# Patient Record
Sex: Female | Born: 1995 | Race: Black or African American | Hispanic: No | Marital: Single | Smoking: Never smoker
Health system: Southern US, Community
[De-identification: ages and names within clinical notes are randomized; demographics above are authoritative.]

## PROBLEM LIST (undated history)

## (undated) DIAGNOSIS — Z789 Other specified health status: Secondary | ICD-10-CM

## (undated) HISTORY — PX: NO PAST SURGERIES: SHX2092

---

## 2016-02-20 ENCOUNTER — Emergency Department (HOSPITAL_COMMUNITY)
Admission: EM | Admit: 2016-02-20 | Discharge: 2016-02-20 | Disposition: A | Discharge status: Home / Self Care | Payer: Medicaid Other | Attending: Emergency Medicine | Admitting: Emergency Medicine

## 2016-02-20 ENCOUNTER — Encounter (HOSPITAL_COMMUNITY): Payer: Self-pay | Admitting: Emergency Medicine

## 2016-02-20 DIAGNOSIS — J029 Acute pharyngitis, unspecified: Secondary | ICD-10-CM | POA: Diagnosis not present

## 2016-02-20 LAB — RAPID STREP SCREEN (MED CTR MEBANE ONLY): Streptococcus, Group A Screen (Direct): NEGATIVE

## 2016-02-20 NOTE — ED Notes (Signed)
Declined W/C at D/C and was escorted to lobby by RN. 

## 2016-02-20 NOTE — ED Provider Notes (Signed)
[facility] Provider Note   CSN: [id number] Arrival date & time: 02/20/16  1305     History   Chief Complaint Chief Complaint  Patient presents with  . Sore Throat    HPI Morgan Carlson is a 20 y.o. female.  HPI   20 year old female presents today with complaints of sore throat. Patient reports 2 days ago she started having upper respiratory congestion and sore throat. She reports it is painful to swallow, but has no difficulty swallowing eating or drinking. She denies any fever, neck stiffness, drooling, muffled speech, chest pain or shortness of breath. She denies any other complaints. She has not tried any medications prior to arrival.  History reviewed. No pertinent past medical history.  There are no active problems to display for this patient.   History reviewed. No pertinent surgical history.  OB History    No data available       Home Medications    Prior to Admission medications   Not on File    Family History No family history on file.  Social History Social History  Substance Use Topics  . Smoking status: Never Smoker  . Smokeless tobacco: Never Used  . Alcohol use No     Allergies   Review of patient's allergies indicates not on file.   Review of Systems Review of Systems  All other systems reviewed and are negative.    Physical Exam Updated Vital Signs BP 115/69 (BP Location: Left Arm)   Pulse 81   Temp 98.3 F (36.8 C) (Oral)   Resp 16   Ht 5\' 6"  (1.676 m)   Wt 113.9 kg   LMP 01/26/2016   SpO2 100%   BMI 40.53 kg/m   Physical Exam  Constitutional: She is oriented to person, place, and time. She appears well-developed and well-nourished.  HENT:  Head: Normocephalic and atraumatic.  Uvula is midline and rises with phonation, minor amount of tonsillar erythema, and swelling. Symmetrical, no signs of retropharyngeal or peritonsillar abscess. Neck is supple with full active range of motion, no exudate noted  Eyes:  Conjunctivae are normal. Pupils are equal, round, and reactive to light. Right eye exhibits no discharge. Left eye exhibits no discharge. No scleral icterus.  Neck: Normal range of motion. No JVD present. No tracheal deviation present.  Pulmonary/Chest: Effort normal. No stridor.  Neurological: She is alert and oriented to person, place, and time. Coordination normal.  Psychiatric: She has a normal mood and affect. Her behavior is normal. Judgment and thought content normal.  Nursing note and vitals reviewed.   ED Treatments / Results  Labs (all labs ordered are listed, but only abnormal results are displayed) Labs Reviewed  RAPID STREP SCREEN (NOT AT [facility])  CULTURE, GROUP A STREP [facility])    EKG  EKG Interpretation None       Radiology No results found.  Procedures Procedures (including critical care time)  Medications Ordered in ED Medications - No data to display   Initial Impression / Assessment and Plan / ED Course  I have reviewed the triage vital signs and the nursing notes.  Pertinent labs & imaging results that were available during my care of the patient were reviewed by me and considered in my medical decision making (see chart for details).  Clinical Course      Final Clinical Impressions(s) / ED Diagnoses   Final diagnoses:  Pharyngitis    Labs:Rapid strep negative  Imaging:  Consults:  Therapeutics:  Discharge Meds:   Assessment/Plan:  Pt presents with likely viral pharyngitis. No difficulty swallowing, drooling, dysphonia,muffled voice, stridor, swelling of the neck, trismus, mouth pain, swelling/ pain in submandibular area or floor of mouth ,assymetry of tonsils, or ulcerations; unlikely epiglottitis, PTA, submandibular space infection, retropharyngeal space infection, or HIV. Pt treated here in the ED with therapeutics listed above, given strict return precautions, PCP follow-up for re-evaluation if symptoms persist beyond 5-7 days in  duration, return to the ED if they worsen. Pt verbalized understanding and agreement to today's plan and had no further questions or concerns at the time of discharge.       New Prescriptions New Prescriptions   No medications on file     Eyvonne Mechanic, PA-C 02/20/16 1452    Doug Sou, MD 02/20/16 1651

## 2016-02-20 NOTE — Discharge Instructions (Signed)
Please follow-up with primary care for reevaluation of symptoms persist, return to the emergency room immediately if they worsen.

## 2016-02-20 NOTE — ED Triage Notes (Signed)
Pt. Stated, I've had a sore throat for 2 days.

## 2016-02-22 LAB — CULTURE, GROUP A STREP (THRC)

## 2016-05-01 ENCOUNTER — Emergency Department (HOSPITAL_BASED_OUTPATIENT_CLINIC_OR_DEPARTMENT_OTHER)
Admission: EM | Admit: 2016-05-01 | Discharge: 2016-05-01 | Disposition: A | Discharge status: Home / Self Care | Payer: Medicaid Other | Attending: Emergency Medicine | Admitting: Emergency Medicine

## 2016-05-01 ENCOUNTER — Encounter (HOSPITAL_BASED_OUTPATIENT_CLINIC_OR_DEPARTMENT_OTHER): Payer: Self-pay | Admitting: Emergency Medicine

## 2016-05-01 DIAGNOSIS — J029 Acute pharyngitis, unspecified: Secondary | ICD-10-CM | POA: Diagnosis present

## 2016-05-01 LAB — RAPID STREP SCREEN (MED CTR MEBANE ONLY): Streptococcus, Group A Screen (Direct): NEGATIVE

## 2016-05-01 MED ORDER — DEXAMETHASONE SODIUM PHOSPHATE 10 MG/ML IJ SOLN
10.0000 mg | Freq: Once | INTRAMUSCULAR | Status: AC
  Administered 2016-05-01: 10 mg via INTRAMUSCULAR
  Filled 2016-05-01: qty 1

## 2016-05-01 NOTE — ED Notes (Signed)
Pt and mother given d/c instructions as per chart. Verbalizes understanding. No questions. 

## 2016-05-01 NOTE — ED Provider Notes (Signed)
[facility]   CSN: [id number] Arrival date & time: 05/01/16  1925  By signing my name below, I, Octavia Heirrianna Nassar, attest that this documentation has been prepared under the direction and in the presence of Nira ConnPedro Eduardo Tawnia Schirm, MD.  Electronically Signed: Octavia HeirArianna Nassar, ED Scribe. 05/01/16. 10:00 PM.    History   Chief Complaint Chief Complaint  Patient presents with  . Sore Throat    The history is provided by the patient. No language interpreter was used.   HPI Comments: Morgan Carlson is a 20 y.o. female who presents to the Emergency Department complaining of intermittent, gradual worsening sore throat x 5 days. Associated nasal congestion, post-nasal drip, and fever that has alleviated. She has pain with eating and drinking secondary to pain. Pt states she has been having issues with her throat for the past 2 months. She was seen multiple times for her sore throat and notes being diagnosed with strep throat on 11/02. She was prescribed Zithromax to alleviate her sore throat without any relief. Denies rhinorrhea, abdominal pain, or cough.  History reviewed. No pertinent past medical history.  There are no active problems to display for this patient.   History reviewed. No pertinent surgical history.  OB History    No data available       Home Medications    Prior to Admission medications   Not on File    Family History History reviewed. No pertinent family history.  Social History Social History  Substance Use Topics  . Smoking status: Never Smoker  . Smokeless tobacco: Never Used  . Alcohol use No     Allergies   Patient has no known allergies.   Review of Systems Review of Systems  All other systems reviewed and are negative.   A complete 10 system review of systems was obtained and all systems are negative except as noted in the HPI and PMH.   Physical Exam Updated Vital Signs BP 128/83   Pulse 85   Temp 98.6 F (37  C)   Resp 18   Wt 235 lb (106.6 kg)   SpO2 99%   BMI 37.93 kg/m   Physical Exam  Constitutional: She is oriented to person, place, and time. She appears well-developed and well-nourished. No distress.  HENT:  Head: Normocephalic and atraumatic.  Nose: Mucosal edema present.  Mouth/Throat: Posterior oropharyngeal edema and posterior oropharyngeal erythema present. No oropharyngeal exudate or tonsillar abscesses. Tonsils are 3+ on the right. Tonsils are 3+ on the left. Tonsillar exudate.  Swollen nasal mucosa, tonsillar exudates bilaterally  Eyes: Conjunctivae and EOM are normal. Pupils are equal, round, and reactive to light. Right eye exhibits no discharge. Left eye exhibits no discharge. No scleral icterus.  Neck: Normal range of motion. Neck supple.  No lymphadenopathy  Cardiovascular: Normal rate and regular rhythm.  Exam reveals no gallop and no friction rub.   No murmur heard. Pulmonary/Chest: Effort normal and breath sounds normal. No stridor. No respiratory distress. She has no rales.  Abdominal: Soft. She exhibits no distension. There is no tenderness.  No splenomegaly   Musculoskeletal: She exhibits no edema or tenderness.  Neurological: She is alert and oriented to person, place, and time.  Skin: Skin is warm and dry. No rash noted. She is not diaphoretic. No erythema.  Psychiatric: She has a normal mood and affect.  Nursing note and vitals reviewed.    ED Treatments / Results  DIAGNOSTIC STUDIES: Oxygen Saturation is 99% on RA, normal  by my interpretation.  COORDINATION OF CARE:  9:54 PM Discussed treatment plan with pt at bedside and pt agreed to plan.  Labs (all labs ordered are listed, but only abnormal results are displayed) Labs Reviewed  RAPID STREP SCREEN (NOT AT [facility])  CULTURE, GROUP A STREP [facility])    EKG  EKG Interpretation None       Radiology No results found.  Procedures Procedures (including critical care time)  Medications Ordered in  ED Medications - No data to display   Initial Impression / Assessment and Plan / ED Course  I have reviewed the triage vital signs and the nursing notes.  Pertinent labs & imaging results that were available during my care of the patient were reviewed by me and considered in my medical decision making (see chart for details).  Clinical Course     Pharyngitis on exam. Rapid strep negative. Patient already on azithromycin. Cultures sent will await the results. Instructed to complete her course of azithromycin. If cultures show that patient requires other antibiotics due to resistance we will contact the patient.  Safe for discharge with strict return precautions.  Final Clinical Impressions(s) / ED Diagnoses   Final diagnoses:  None   I personally performed the services described in this documentation, which was scribed in my presence. The recorded information has been reviewed and is accurate.   Disposition: Discharge  Condition: Good  I have discussed the results, Dx and Tx plan with the patient who expressed understanding and agree(s) with the plan. Discharge instructions discussed at great length. The patient was given strict return precautions who verbalized understanding of the instructions. No further questions at time of discharge.    New Prescriptions   No medications on file    Follow Up: Primary Care Provider  Schedule an appointment as soon as possible for a visit        Nira ConnPedro Eduardo Kyran Connaughton, MD 05/01/16 2213

## 2016-05-01 NOTE — ED Triage Notes (Signed)
Pt in c/o sore throat x 2 months. Seen multiple times, treated for strep. Pt alert, interactive, ambulatory in NAD.

## 2016-05-04 LAB — CULTURE, GROUP A STREP (THRC)

## 2018-01-24 ENCOUNTER — Encounter (HOSPITAL_COMMUNITY): Payer: Self-pay

## 2018-01-24 ENCOUNTER — Ambulatory Visit (HOSPITAL_COMMUNITY)
Admission: EM | Admit: 2018-01-24 | Discharge: 2018-01-24 | Disposition: A | Discharge status: Home / Self Care | Payer: Self-pay | Attending: Internal Medicine | Admitting: Internal Medicine

## 2018-01-24 DIAGNOSIS — H9201 Otalgia, right ear: Secondary | ICD-10-CM

## 2018-01-24 DIAGNOSIS — J351 Hypertrophy of tonsils: Secondary | ICD-10-CM

## 2018-01-24 DIAGNOSIS — Z9109 Other allergy status, other than to drugs and biological substances: Secondary | ICD-10-CM

## 2018-01-24 DIAGNOSIS — R070 Pain in throat: Secondary | ICD-10-CM

## 2018-01-24 DIAGNOSIS — J029 Acute pharyngitis, unspecified: Secondary | ICD-10-CM

## 2018-01-24 LAB — POCT RAPID STREP A: STREPTOCOCCUS, GROUP A SCREEN (DIRECT): NEGATIVE

## 2018-01-24 MED ORDER — PSEUDOEPHEDRINE HCL ER 120 MG PO TB12: 120.0000 mg | ORAL_TABLET | Freq: Two times a day (BID) | ORAL | 3 refills | Status: DC

## 2018-01-24 MED ORDER — CETIRIZINE HCL 10 MG PO TABS: 10.0000 mg | ORAL_TABLET | Freq: Every day | ORAL | 1 refills | Status: DC

## 2018-01-24 MED ORDER — FLUTICASONE PROPIONATE 50 MCG/ACT NA SUSP: 2.0000 | Freq: Every day | NASAL | 5 refills | Status: DC

## 2018-01-24 NOTE — Discharge Instructions (Addendum)
Hydrate well with at least 2 liters (1 gallon) of water daily. You may take 500mg  Tylenol with ibuprofen 400-600mg  every 6 hours for pain and inflammation. For sore throat try using a honey-based tea. Use 3 teaspoons of honey with juice squeezed from half lemon. Place shaved pieces of ginger into 1/2-1 cup of water and warm over stove top. Then mix the ingredients and repeat every 4 hours as needed.  Set up a follow-up appointment for a consult with your ENT physician to reconsider your tonsillectomy/tonsil removal.  We will let you know if your strep culture turns out to be positive. Thank you for letting me take care of you today!

## 2018-01-24 NOTE — ED Provider Notes (Signed)
  MRN: [id number] DOB: Dec 09, 1995  Subjective:   Morgan Carlson is a 22 y.o. female presenting for 1 week history of throat pain. Has also had runny nose, stuffy nose, right ear pain with wet type sensation. Reports that she has had a history of tonsillitis. Denies fever, cough, chest pain, shob. Takes Zyrtec daily for her allergies. Denies smoking cigarettes. Does not hydrate well.    Takes Zyrtec daily.    Allergies  Allergen Reactions  . Peanut-Containing Drug Products    Past medical history of allergies. Denies past surgical history.   Objective:   Vitals: Pulse 89   Temp 98.1 F (36.7 C) (Oral)   Resp 20   LMP 12/24/2017   SpO2 99%   Physical Exam  Constitutional: She is oriented to person, place, and time. She appears well-developed and well-nourished.  HENT:  Right Ear: Tympanic membrane normal.  Left Ear: Tympanic membrane normal.  Nose: Mucosal edema and rhinorrhea present. No sinus tenderness.  Mouth/Throat: Mucous membranes are normal. No oropharyngeal exudate, posterior oropharyngeal edema, posterior oropharyngeal erythema or tonsillar abscesses. No tonsillar exudate.    Cardiovascular: Normal rate.  Pulmonary/Chest: Effort normal.  Neurological: She is alert and oriented to person, place, and time.    Results for orders placed or performed during the hospital encounter of 01/24/18 (from the past 24 hour(s))  POCT rapid strep A [facility])     Status: None   Collection Time: 01/24/18  8:18 PM  Result Value Ref Range   Streptococcus, Group A Screen (Direct) NEGATIVE NEGATIVE    Assessment and Plan :   Viral pharyngitis  Environmental allergies  Throat pain  Right ear pain  Counseled patient on management of allergies.  She does have hypertrophied tonsils.  Admits that it was recommended she have a tonsillectomy years ago but she did not follow through with this.  For now we will use supportive care, strep culture pending.  Return to clinic  precautions reviewed.  I also counseled patient to set up an office visit again with her ENT doctor.

## 2018-01-24 NOTE — ED Triage Notes (Signed)
Pt presents with sore throat and bloody sputum

## 2018-01-27 LAB — CULTURE, GROUP A STREP (THRC)

## 2018-06-27 NOTE — L&D Delivery Note (Signed)
OB/GYN Faculty Practice Delivery Note  Morgan Carlson is a 23 y.o. G1P0 s/p NSVD at [redacted]w[redacted]d. She was admitted for spontaneous labor.   ROM: 0h 27m with clear fluid GBS Status: Negative  Maximum Maternal Temperature: 99.1 F    Labor Progress: . Patient arrived at 5 cm dilation and progressed spontaneously to fully dilated..   Delivery Date/Time: 03/02/2019 at [phone] Delivery: Called to room and patient was complete and pushing. Head delivered in LOA position. No nuchal cord present. Shoulder and body delivered in usual fashion. Infant with spontaneous cry, placed on mother's abdomen, dried and stimulated. Cord clamped x 2 after 1-minute delay, and cut by father. Cord blood drawn. Placenta delivered spontaneously with gentle cord traction. Fundus firm with massage and Pitocin. Labia, perineum, vagina, and cervix inspected with 2nd degree perineal repaired in the usual fashion.   Placenta: 3v, intact Complications: none Lacerations: 2nd degree perineal, repaired EBL: 288 Analgesia: epidural   Infant: APGAR (1 MIN): 9   APGAR (5 MINS): 9    Weight: pending  Augustin Coupe, MD/[facility] OB/GYN Fellow, Faculty Practice

## 2018-07-16 ENCOUNTER — Ambulatory Visit (INDEPENDENT_AMBULATORY_CARE_PROVIDER_SITE_OTHER): Payer: Medicaid Other | Admitting: *Deleted

## 2018-07-16 ENCOUNTER — Encounter: Payer: Self-pay | Admitting: Family Medicine

## 2018-07-16 DIAGNOSIS — Z3200 Encounter for pregnancy test, result unknown: Secondary | ICD-10-CM

## 2018-07-16 DIAGNOSIS — Z3201 Encounter for pregnancy test, result positive: Secondary | ICD-10-CM

## 2018-07-16 LAB — POCT PREGNANCY, URINE: PREG TEST UR: POSITIVE — AB

## 2018-07-16 NOTE — Progress Notes (Addendum)
Here for pregnancy test which was positive. States LMP was 05/26/18 or a day or two before that. This makes her [redacted]w[redacted]d with EDD 03/01/18. Recommended she start prenatal care and prenatal vitamins.  Jennette Banker of Attending Supervision of RN: Evaluation and management procedures were performed by the nurse under my supervision and collaboration.  I have reviewed the nursing note and chart, and I agree with the management and plan.  Carolyn L. Harraway-Smith, M.D., [facility]

## 2018-08-09 ENCOUNTER — Encounter: Payer: Self-pay | Admitting: Family Medicine

## 2018-08-09 ENCOUNTER — Other Ambulatory Visit (HOSPITAL_COMMUNITY)
Admission: RE | Admit: 2018-08-09 | Discharge: 2018-08-09 | Disposition: A | Discharge status: Home / Self Care | Payer: Medicaid Other | Source: Ambulatory Visit | Attending: Family Medicine | Admitting: Family Medicine

## 2018-08-09 ENCOUNTER — Ambulatory Visit (INDEPENDENT_AMBULATORY_CARE_PROVIDER_SITE_OTHER): Payer: Medicaid Other | Admitting: Family Medicine

## 2018-08-09 DIAGNOSIS — O99011 Anemia complicating pregnancy, first trimester: Secondary | ICD-10-CM | POA: Diagnosis not present

## 2018-08-09 DIAGNOSIS — L2082 Flexural eczema: Secondary | ICD-10-CM

## 2018-08-09 DIAGNOSIS — Z3401 Encounter for supervision of normal first pregnancy, first trimester: Secondary | ICD-10-CM

## 2018-08-09 DIAGNOSIS — O99211 Obesity complicating pregnancy, first trimester: Secondary | ICD-10-CM | POA: Diagnosis not present

## 2018-08-09 DIAGNOSIS — Z3400 Encounter for supervision of normal first pregnancy, unspecified trimester: Secondary | ICD-10-CM | POA: Diagnosis present

## 2018-08-09 DIAGNOSIS — L309 Dermatitis, unspecified: Secondary | ICD-10-CM | POA: Insufficient documentation

## 2018-08-09 DIAGNOSIS — D573 Sickle-cell trait: Secondary | ICD-10-CM

## 2018-08-09 MED ORDER — FLUTICASONE PROPIONATE 50 MCG/ACT NA SUSP: 1.0000 | Freq: Every day | NASAL | 2 refills | Status: DC

## 2018-08-09 MED ORDER — TRIAMCINOLONE ACETONIDE 0.5 % EX CREA: 1.0000 "application " | TOPICAL_CREAM | Freq: Three times a day (TID) | CUTANEOUS | 0 refills | Status: DC

## 2018-08-09 NOTE — Progress Notes (Signed)
  Subjective:  Morgan Carlson is a G1P0 [redacted]w[redacted]d being seen today for her first obstetrical visit.  Her obstetrical history is significant for obesity and sickle cell trait. FOB involved. Planned pregnancy. Patient does intend to breast feed. Pregnancy history fully reviewed.  Patient reports no complaints.  BP 113/67   Pulse 82   Wt 224 lb (101.6 kg)   LMP 05/26/2018 (Exact Date)   BMI 36.15 kg/m   HISTORY: OB History  Gravida Para Term Preterm AB Living  1            SAB TAB Ectopic Multiple Live Births               # Outcome Date GA Lbr Len/2nd Weight Sex Delivery Anes PTL Lv  1 Current             History reviewed. No pertinent past medical history.  History reviewed. No pertinent surgical history.  History reviewed. No pertinent family history.   Exam  BP 113/67   Pulse 82   Wt 224 lb (101.6 kg)   LMP 05/26/2018 (Exact Date)   BMI 36.15 kg/m   CONSTITUTIONAL: Well-developed, well-nourished female in no acute distress.  HENT:  Normocephalic, atraumatic, External right and left ear normal. Oropharynx is clear and moist EYES: Conjunctivae and EOM are normal. Pupils are equal, round, and reactive to light. No scleral icterus.  NECK: Normal range of motion, supple, no masses.  Normal thyroid.  CARDIOVASCULAR: Normal heart rate noted, regular rhythm RESPIRATORY: Clear to auscultation bilaterally. Effort and breath sounds normal, no problems with respiration noted. BREASTS: Symmetric in size. No masses, skin changes, nipple drainage, or lymphadenopathy. ABDOMEN: Soft, normal bowel sounds, no distention noted.  No tenderness, rebound or guarding.  PELVIC: Normal appearing external genitalia; normal appearing vaginal mucosa and cervix. No abnormal discharge noted. Normal uterine size, no other palpable masses, no uterine or adnexal tenderness. MUSCULOSKELETAL: Normal range of motion. No tenderness.  No cyanosis, clubbing, or edema.  2+ distal pulses. SKIN: Skin is warm and  dry. Eczematic patches. Not diaphoretic. No erythema. No pallor. NEUROLOGIC: Alert and oriented to person, place, and time. Normal reflexes, muscle tone coordination. No cranial nerve deficit noted. PSYCHIATRIC: Normal mood and affect. Normal behavior. Normal judgment and thought content.    Assessment:    Pregnancy: G1P0 Patient Active Problem List   Diagnosis Date Noted  . Supervision of normal first pregnancy, antepartum 08/09/2018      Plan:   1. Supervision of normal first pregnancy, antepartum Discussed nature of practice: CNMs, fellows, residents. Desires panorama.  - [facility] AMB BABYSCRIPTS OPT IN - Culture, OB Urine - Hemoglobinopathy Evaluation - Obstetric Panel, Including HIV - SMN1 COPY NUMBER ANALYSIS (SMA Carrier Screen) - Cytology - PAP( [place]) - POC Urinalysis Dipstick OB - Genetic Screening - US MFM OB COMP + 14 WK; Future  2. Sickle cell trait (HCC) Known SS trait from birth Discussed having FOB getting tested.  3. Flexural eczema Triamcinolone prescribed.   Problem list reviewed and updated. 75% of 30 min visit spent on counseling and coordination of care.     Levie Heritage 08/09/2018

## 2018-08-11 LAB — CULTURE, OB URINE

## 2018-08-11 LAB — URINE CULTURE, OB REFLEX

## 2018-08-14 LAB — CYTOLOGY - PAP
Chlamydia: NEGATIVE
DIAGNOSIS: NEGATIVE
Neisseria Gonorrhea: NEGATIVE

## 2018-08-20 LAB — OBSTETRIC PANEL, INCLUDING HIV
ANTIBODY SCREEN: NEGATIVE
BASOS: 0 %
Basophils Absolute: 0 10*3/uL (ref 0.0–0.2)
EOS (ABSOLUTE): 0.2 10*3/uL (ref 0.0–0.4)
EOS: 3 %
HEMATOCRIT: 36.4 % (ref 34.0–46.6)
HEMOGLOBIN: 12.4 g/dL (ref 11.1–15.9)
HEP B S AG: NEGATIVE
HIV SCREEN 4TH GENERATION: NONREACTIVE
Immature Grans (Abs): 0 10*3/uL (ref 0.0–0.1)
Immature Granulocytes: 0 %
Lymphocytes Absolute: 2.5 10*3/uL (ref 0.7–3.1)
Lymphs: 32 %
MCH: 27.9 pg (ref 26.6–33.0)
MCHC: 34.1 g/dL (ref 31.5–35.7)
MCV: 82 fL (ref 79–97)
MONOCYTES: 7 %
Monocytes Absolute: 0.5 10*3/uL (ref 0.1–0.9)
NEUTROS ABS: 4.5 10*3/uL (ref 1.4–7.0)
Neutrophils: 58 %
Platelets: 209 10*3/uL (ref 150–450)
RBC: 4.44 x10E6/uL (ref 3.77–5.28)
RDW: 14.6 % (ref 11.7–15.4)
RH TYPE: POSITIVE
RPR: NONREACTIVE
RUBELLA: 7.74 {index} (ref >0.99)
WBC: 7.8 10*3/uL (ref 3.4–10.8)

## 2018-08-20 LAB — HEMOGLOBINOPATHY EVALUATION
Ferritin: 39 ng/mL (ref 15–150)
HGB F QUANT: 0 % (ref 0.0–2.0)
Hgb A2 Quant: 3.3 % — ABNORMAL HIGH (ref 1.8–3.2)
Hgb A: 57 % — ABNORMAL LOW (ref 96.4–98.8)
Hgb C: 0 %
Hgb S: 39.7 % — ABNORMAL HIGH
Hgb Solubility: POSITIVE — AB
Hgb Variant: 0 %

## 2018-08-20 LAB — SMN1 COPY NUMBER ANALYSIS (SMA CARRIER SCREENING)

## 2018-09-06 ENCOUNTER — Other Ambulatory Visit: Payer: Self-pay

## 2018-09-06 ENCOUNTER — Ambulatory Visit (INDEPENDENT_AMBULATORY_CARE_PROVIDER_SITE_OTHER): Payer: Medicaid Other | Admitting: Family Medicine

## 2018-09-06 VITALS — BP 112/56 | HR 80 | Wt 229.0 lb

## 2018-09-06 DIAGNOSIS — O99012 Anemia complicating pregnancy, second trimester: Secondary | ICD-10-CM

## 2018-09-06 DIAGNOSIS — L2082 Flexural eczema: Secondary | ICD-10-CM

## 2018-09-06 DIAGNOSIS — Z3A14 14 weeks gestation of pregnancy: Secondary | ICD-10-CM

## 2018-09-06 DIAGNOSIS — D573 Sickle-cell trait: Secondary | ICD-10-CM

## 2018-09-06 DIAGNOSIS — O99712 Diseases of the skin and subcutaneous tissue complicating pregnancy, second trimester: Secondary | ICD-10-CM

## 2018-09-06 DIAGNOSIS — Z3400 Encounter for supervision of normal first pregnancy, unspecified trimester: Secondary | ICD-10-CM

## 2018-09-06 NOTE — Progress Notes (Signed)
   PRENATAL VISIT NOTE  Subjective:  Morgan Carlson is a 23 y.o. G1P0 at [redacted]w[redacted]d being seen today for ongoing prenatal care.  She is currently monitored for the following issues for this low-risk pregnancy and has Supervision of normal first pregnancy, antepartum; Sickle cell trait (HCC); and Eczema on their problem list.  Patient reports no complaints.  Contractions: Not present. Vag. Bleeding: None.  Movement: Absent. Denies leaking of fluid.   The following portions of the patient's history were reviewed and updated as appropriate: allergies, current medications, past family history, past medical history, past social history, past surgical history and problem list.   Objective:   Vitals:   09/06/18 1530  BP: (!) 112/56  Pulse: 80  Weight: 229 lb 0.6 oz (103.9 kg)    Fetal Status: Fetal Heart Rate (bpm): 151   Movement: Absent     General:  Alert, oriented and cooperative. Patient is in no acute distress.  Skin: Skin is warm and dry. No rash noted.   Cardiovascular: Normal heart rate noted  Respiratory: Normal respiratory effort, no problems with respiration noted  Abdomen: Soft, gravid, appropriate for gestational age.  Pain/Pressure: Present     Pelvic: Cervical exam deferred        Extremities: Normal range of motion.  Edema: None  Mental Status: Normal mood and affect. Normal behavior. Normal judgment and thought content.   Assessment and Plan:  Pregnancy: G1P0 at [redacted]w[redacted]d 1. Supervision of normal first pregnancy, antepartum FHT and FH normal  2. Sickle cell trait (HCC)  3. Flexural eczema   Preterm labor symptoms and general obstetric precautions including but not limited to vaginal bleeding, contractions, leaking of fluid and fetal movement were reviewed in detail with the patient. Please refer to After Visit Summary for other counseling recommendations.   Return in about 4 weeks (around 10/04/2018) for OB f/u.  Future Appointments  Date Time Provider Department Center   10/10/2018  1:45 PM [facility] [place] 2 [facility]  10/12/2018 10:45 AM Levie Heritage, DO [facility] None    Levie Heritage, DO

## 2018-10-09 ENCOUNTER — Ambulatory Visit (INDEPENDENT_AMBULATORY_CARE_PROVIDER_SITE_OTHER): Payer: Medicaid Other | Admitting: Advanced Practice Midwife

## 2018-10-09 ENCOUNTER — Encounter: Payer: Self-pay | Admitting: Advanced Practice Midwife

## 2018-10-09 ENCOUNTER — Encounter (HOSPITAL_COMMUNITY): Payer: Self-pay

## 2018-10-09 DIAGNOSIS — Z3A19 19 weeks gestation of pregnancy: Secondary | ICD-10-CM | POA: Diagnosis not present

## 2018-10-09 DIAGNOSIS — Z3400 Encounter for supervision of normal first pregnancy, unspecified trimester: Secondary | ICD-10-CM

## 2018-10-09 DIAGNOSIS — Z3402 Encounter for supervision of normal first pregnancy, second trimester: Secondary | ICD-10-CM | POA: Diagnosis not present

## 2018-10-09 NOTE — Patient Instructions (Signed)

## 2018-10-09 NOTE — Progress Notes (Signed)
Patient has anatomy ultrasound tomorrow 10-10-2018. Armandina Stammer RN

## 2018-10-09 NOTE — Progress Notes (Signed)
   PRENATAL VISIT NOTE  TELEHEALTH VISIT I connected with  this patient who is 104w3d      10/09/18 by a Telephone telemedicine application and verified that I am speaking with the correct person using two identifiers.   I discussed the limitations of evaluation and management by telemedicine. The patient expressed understanding and agreed to proceed  Patient was at home I was in office. Armandina Stammer RN initiated call Total time spent 6 minutes..   Subjective:  Morgan Carlson is a 23 y.o. G1P0 at [redacted]w[redacted]d being seen today for ongoing prenatal care.  She is currently monitored for the following issues for this low-risk pregnancy and has Supervision of normal first pregnancy, antepartum; Sickle cell trait (HCC); and Eczema on their problem list.  Patient reports no complaints.  Contractions: Not present. Vag. Bleeding: None.  Movement: Present. Denies leaking of fluid.   The following portions of the patient's history were reviewed and updated as appropriate: allergies, current medications, past family history, past medical history, past social history, past surgical history and problem list.   Objective:  There were no vitals filed for this visit.  Fetal Status:     Movement: Present     General:  Alert, oriented and cooperative. Patient is in no acute distress.  Skin: Skin is warm and dry. No rash noted.   Cardiovascular: Normal heart rate noted  Respiratory: Normal respiratory effort, no problems with respiration noted  Abdomen: Soft, gravid, appropriate for gestational age.  Pain/Pressure: Absent     Pelvic: Cervical exam deferred        Extremities: Normal range of motion.  Edema: None  Mental Status: Normal mood and affect. Normal behavior. Normal judgment and thought content.   Assessment and Plan:  Pregnancy: G1P0 at [redacted]w[redacted]d 1. Supervision of normal first pregnancy, antepartum     Reviewed normal changes and developments for this stage of pregnancy       Discussed round  ligament pain (has had a few) and went over signs to report.     Scheduled for followup anatomy US, reviewed new location. Will come to get BP cuff  - Babyscripts Schedule Optimization  Preterm labor symptoms and general obstetric precautions including but not limited to vaginal bleeding, contractions, leaking of fluid and fetal movement were reviewed in detail with the patient. Please refer to After Visit Summary for other counseling recommendations.   Return in about 4 weeks (around 11/06/2018) for TELEHEALTH VISIT.  Future Appointments  Date Time Provider Department Center  10/10/2018  1:45 PM [facility] [place] 2 [facility]  11/06/2018  9:15 AM Aviva Signs, CNM [facility] None    Wynelle Bourgeois, CNM

## 2018-10-10 ENCOUNTER — Ambulatory Visit (HOSPITAL_COMMUNITY)
Admission: RE | Admit: 2018-10-10 | Discharge: 2018-10-10 | Disposition: A | Discharge status: Home / Self Care | Payer: Medicaid Other | Source: Ambulatory Visit | Attending: Family Medicine | Admitting: Family Medicine

## 2018-10-10 ENCOUNTER — Other Ambulatory Visit: Payer: Self-pay

## 2018-10-10 ENCOUNTER — Other Ambulatory Visit: Payer: Self-pay | Admitting: Family Medicine

## 2018-10-10 DIAGNOSIS — O3412 Maternal care for benign tumor of corpus uteri, second trimester: Secondary | ICD-10-CM | POA: Diagnosis not present

## 2018-10-10 DIAGNOSIS — Z3A19 19 weeks gestation of pregnancy: Secondary | ICD-10-CM

## 2018-10-10 DIAGNOSIS — Z862 Personal history of diseases of the blood and blood-forming organs and certain disorders involving the immune mechanism: Secondary | ICD-10-CM

## 2018-10-10 DIAGNOSIS — Z363 Encounter for antenatal screening for malformations: Secondary | ICD-10-CM

## 2018-10-10 DIAGNOSIS — Z3400 Encounter for supervision of normal first pregnancy, unspecified trimester: Secondary | ICD-10-CM

## 2018-10-10 DIAGNOSIS — O99212 Obesity complicating pregnancy, second trimester: Secondary | ICD-10-CM | POA: Diagnosis not present

## 2018-10-11 ENCOUNTER — Other Ambulatory Visit (HOSPITAL_COMMUNITY): Payer: Self-pay | Admitting: *Deleted

## 2018-10-11 DIAGNOSIS — Z362 Encounter for other antenatal screening follow-up: Secondary | ICD-10-CM

## 2018-10-12 ENCOUNTER — Encounter: Payer: Medicaid Other | Admitting: Family Medicine

## 2018-10-17 ENCOUNTER — Other Ambulatory Visit: Payer: Self-pay

## 2018-10-17 DIAGNOSIS — Z3400 Encounter for supervision of normal first pregnancy, unspecified trimester: Secondary | ICD-10-CM

## 2018-10-17 MED ORDER — VITAFOL GUMMIES 3.33-0.333-34.8 MG PO CHEW: 34.8000 mg | CHEWABLE_TABLET | Freq: Every day | ORAL | 11 refills | Status: DC

## 2018-11-02 ENCOUNTER — Encounter: Payer: Self-pay | Admitting: Advanced Practice Midwife

## 2018-11-06 ENCOUNTER — Other Ambulatory Visit: Payer: Self-pay

## 2018-11-06 ENCOUNTER — Ambulatory Visit (INDEPENDENT_AMBULATORY_CARE_PROVIDER_SITE_OTHER): Payer: Medicaid Other | Admitting: Obstetrics & Gynecology

## 2018-11-06 ENCOUNTER — Encounter: Payer: Self-pay | Admitting: Obstetrics & Gynecology

## 2018-11-06 VITALS — BP 118/63 | HR 78 | Temp 97.9°F | Wt 251.0 lb

## 2018-11-06 DIAGNOSIS — Z3400 Encounter for supervision of normal first pregnancy, unspecified trimester: Secondary | ICD-10-CM

## 2018-11-06 DIAGNOSIS — O26892 Other specified pregnancy related conditions, second trimester: Secondary | ICD-10-CM

## 2018-11-06 DIAGNOSIS — O99210 Obesity complicating pregnancy, unspecified trimester: Secondary | ICD-10-CM | POA: Insufficient documentation

## 2018-11-06 DIAGNOSIS — D573 Sickle-cell trait: Secondary | ICD-10-CM

## 2018-11-06 DIAGNOSIS — O99212 Obesity complicating pregnancy, second trimester: Secondary | ICD-10-CM

## 2018-11-06 DIAGNOSIS — Z3A23 23 weeks gestation of pregnancy: Secondary | ICD-10-CM

## 2018-11-06 LAB — POCT URINALYSIS DIPSTICK OB: POC,PROTEIN,UA: NEGATIVE

## 2018-11-06 NOTE — Progress Notes (Signed)
   PRENATAL VISIT NOTE  Subjective:  Morgan Carlson is a 23 y.o. G1P0 at [redacted]w[redacted]d being seen today for ongoing prenatal care.  She is currently monitored for the following issues for this low-risk pregnancy and has Supervision of normal first pregnancy, antepartum; Sickle cell trait (HCC); and Eczema on their problem list.  Patient reports no complaints.  Contractions: Not present. Vag. Bleeding: None.  Movement: Present. Denies leaking of fluid.   The following portions of the patient's history were reviewed and updated as appropriate: allergies, current medications, past family history, past medical history, past social history, past surgical history and problem list.   Objective:   Vitals:   11/06/18 0920  BP: 118/63  Pulse: 78  Temp: 97.9 F (36.6 C)  Weight: 251 lb (113.9 kg)    Fetal Status: Fetal Heart Rate (bpm): 156   Movement: Present     General:  Alert, oriented and cooperative. Patient is in no acute distress.  Skin: Skin is warm and dry. No rash noted.   Cardiovascular: Normal heart rate noted  Respiratory: Normal respiratory effort, no problems with respiration noted  Abdomen: Soft, gravid, appropriate for gestational age.  Pain/Pressure: Present     Pelvic: Cervical exam deferred        Extremities: Normal range of motion.  Edema: None  Mental Status: Normal mood and affect. Normal behavior. Normal judgment and thought content.   Assessment and Plan:  Pregnancy: G1P0 at [redacted]w[redacted]d 1. Supervision of normal first pregnancy, antepartum FH and FHR WNL - POC Urinalysis Dipstick OB  2. Sickle cell trait (HCC) No sx.   3. Obesity ante Reviewed exercise and weight management   Preterm labor symptoms and general obstetric precautions including but not limited to vaginal bleeding, contractions, leaking of fluid and fetal movement were reviewed in detail with the patient. Please refer to After Visit Summary for other counseling recommendations.   Return in about 5 weeks  (around 12/11/2018).  Future Appointments  Date Time Provider Department Center  11/21/2018  1:45 PM [facility] [place] 2 [facility]    Willodean Rosenthal, MD

## 2018-11-06 NOTE — Patient Instructions (Signed)

## 2018-11-21 ENCOUNTER — Other Ambulatory Visit (HOSPITAL_COMMUNITY): Payer: Self-pay | Admitting: *Deleted

## 2018-11-21 ENCOUNTER — Other Ambulatory Visit: Payer: Self-pay

## 2018-11-21 ENCOUNTER — Ambulatory Visit (HOSPITAL_COMMUNITY)
Admission: RE | Admit: 2018-11-21 | Discharge: 2018-11-21 | Disposition: A | Discharge status: Home / Self Care | Payer: Medicaid Other | Source: Ambulatory Visit | Attending: Obstetrics and Gynecology | Admitting: Obstetrics and Gynecology

## 2018-11-21 DIAGNOSIS — Z3A25 25 weeks gestation of pregnancy: Secondary | ICD-10-CM | POA: Diagnosis not present

## 2018-11-21 DIAGNOSIS — O99212 Obesity complicating pregnancy, second trimester: Secondary | ICD-10-CM

## 2018-11-21 DIAGNOSIS — O3412 Maternal care for benign tumor of corpus uteri, second trimester: Secondary | ICD-10-CM | POA: Diagnosis not present

## 2018-11-21 DIAGNOSIS — Z362 Encounter for other antenatal screening follow-up: Secondary | ICD-10-CM

## 2018-11-21 DIAGNOSIS — I4729 Other ventricular tachycardia: Secondary | ICD-10-CM

## 2018-11-21 DIAGNOSIS — Z363 Encounter for antenatal screening for malformations: Secondary | ICD-10-CM

## 2018-11-21 DIAGNOSIS — I472 Ventricular tachycardia: Secondary | ICD-10-CM

## 2018-12-13 ENCOUNTER — Ambulatory Visit (INDEPENDENT_AMBULATORY_CARE_PROVIDER_SITE_OTHER): Payer: Medicaid Other | Admitting: Obstetrics & Gynecology

## 2018-12-13 ENCOUNTER — Other Ambulatory Visit: Payer: Self-pay

## 2018-12-13 VITALS — BP 106/58 | HR 95 | Wt 256.1 lb

## 2018-12-13 DIAGNOSIS — Z23 Encounter for immunization: Secondary | ICD-10-CM

## 2018-12-13 DIAGNOSIS — O26893 Other specified pregnancy related conditions, third trimester: Secondary | ICD-10-CM

## 2018-12-13 DIAGNOSIS — O99210 Obesity complicating pregnancy, unspecified trimester: Secondary | ICD-10-CM

## 2018-12-13 DIAGNOSIS — D573 Sickle-cell trait: Secondary | ICD-10-CM

## 2018-12-13 DIAGNOSIS — Z3400 Encounter for supervision of normal first pregnancy, unspecified trimester: Secondary | ICD-10-CM

## 2018-12-13 DIAGNOSIS — O99213 Obesity complicating pregnancy, third trimester: Secondary | ICD-10-CM

## 2018-12-13 DIAGNOSIS — Z3A28 28 weeks gestation of pregnancy: Secondary | ICD-10-CM

## 2018-12-13 NOTE — Progress Notes (Signed)
   PRENATAL VISIT NOTE  Subjective:  Morgan Carlson is a 23 y.o. G1P0 at [redacted]w[redacted]d being seen today for ongoing prenatal care.  She is currently monitored for the following issues for this high-risk pregnancy and has Supervision of normal first pregnancy, antepartum; Sickle cell trait ([place]); Eczema; and Obesity in pregnancy, antepartum on their problem list.  Patient reports round ligament pain.  Contractions: Not present. Vag. Bleeding: None.  Movement: Present. Denies leaking of fluid.   The following portions of the patient's history were reviewed and updated as appropriate: allergies, current medications, past family history, past medical history, past social history, past surgical history and problem list.   Objective:   Vitals:   12/13/18 1019  BP: (!) 106/58  Pulse: 95  Weight: 256 lb 1.9 oz (116.2 kg)    Fetal Status:     Movement: Present     General:  Alert, oriented and cooperative. Patient is in no acute distress.  Skin: Skin is warm and dry. No rash noted.   Cardiovascular: Normal heart rate noted  Respiratory: Normal respiratory effort, no problems with respiration noted  Abdomen: Soft, gravid, appropriate for gestational age.  Pain/Pressure: Absent     Pelvic: Cervical exam deferred        Extremities: Normal range of motion.  Edema: None  Mental Status: Normal mood and affect. Normal behavior. Normal judgment and thought content.   Assessment and Plan:  Pregnancy: G1P0 at [redacted]w[redacted]d 1. Supervision of normal first pregnancy, antepartum 28 week labs today   2. Sickle cell trait ([place])  3. Obesity in pregnancy, antepartum 11/21/2018 922  gm      2 lb 1 oz     68  %  Preterm labor symptoms and general obstetric precautions including but not limited to vaginal bleeding, contractions, leaking of fluid and fetal movement were reviewed in detail with the patient. Please refer to After Visit Summary for other counseling recommendations.   Return in about 4 weeks (around  01/10/2019).  Future Appointments  Date Time Provider [place]  12/19/2018  1:15 PM [facility] [place] 4 [facility]    Lavonia Drafts, MD

## 2018-12-13 NOTE — Patient Instructions (Signed)
Third Trimester of Pregnancy The third trimester is from week 28 through week 40 (months 7 through 9). The third trimester is a time when the unborn baby (fetus) is growing rapidly. At the end of the ninth month, the fetus is about 20 inches in length and weighs 6-10 pounds. Body changes during your third trimester Your body will continue to go through many changes during pregnancy. The changes vary from woman to woman. During the third trimester:  Your weight will continue to increase. You can expect to gain 25-35 pounds (11-16 kg) by the end of the pregnancy.  You may begin to get stretch marks on your hips, abdomen, and breasts.  You may urinate more often because the fetus is moving lower into your pelvis and pressing on your bladder.  You may develop or continue to have heartburn. This is caused by increased hormones that slow down muscles in the digestive tract.  You may develop or continue to have constipation because increased hormones slow digestion and cause the muscles that push waste through your intestines to relax.  You may develop hemorrhoids. These are swollen veins (varicose veins) in the rectum that can itch or be painful.  You may develop swollen, bulging veins (varicose veins) in your legs.  You may have increased body aches in the pelvis, back, or thighs. This is due to weight gain and increased hormones that are relaxing your joints.  You may have changes in your hair. These can include thickening of your hair, rapid growth, and changes in texture. Some women also have hair loss during or after pregnancy, or hair that feels dry or thin. Your hair will most likely return to normal after your baby is born.  Your breasts will continue to grow and they will continue to become tender. A yellow fluid (colostrum) may leak from your breasts. This is the first milk you are producing for your baby.  Your belly button may stick out.  You may notice more swelling in your hands,  face, or ankles.  You may have increased tingling or numbness in your hands, arms, and legs. The skin on your belly may also feel numb.  You may feel short of breath because of your expanding uterus.  You may have more problems sleeping. This can be caused by the size of your belly, increased need to urinate, and an increase in your body's metabolism.  You may notice the fetus "dropping," or moving lower in your abdomen (lightening).  You may have increased vaginal discharge.  You may notice your joints feel loose and you may have pain around your pelvic bone. What to expect at prenatal visits You will have prenatal exams every 2 weeks until week 36. Then you will have weekly prenatal exams. During a routine prenatal visit:  You will be weighed to make sure you and the baby are growing normally.  Your blood pressure will be taken.  Your abdomen will be measured to track your baby's growth.  The fetal heartbeat will be listened to.  Any test results from the previous visit will be discussed.  You may have a cervical check near your due date to see if your cervix has softened or thinned (effaced).  You will be tested for Group B streptococcus. This happens between 35 and 37 weeks. Your health care provider may ask you:  What your birth plan is.  How you are feeling.  If you are feeling the baby move.  If you have had any abnormal   symptoms, such as leaking fluid, bleeding, severe headaches, or abdominal cramping.  If you are using any tobacco products, including cigarettes, chewing tobacco, and electronic cigarettes.  If you have any questions. Other tests or screenings that may be performed during your third trimester include:  Blood tests that check for low iron levels (anemia).  Fetal testing to check the health, activity level, and growth of the fetus. Testing is done if you have certain medical conditions or if there are problems during the pregnancy.  Nonstress test  (NST). This test checks the health of your baby to make sure there are no signs of problems, such as the baby not getting enough oxygen. During this test, a belt is placed around your belly. The baby is made to move, and its heart rate is monitored during movement. What is false labor? False labor is a condition in which you feel small, irregular tightenings of the muscles in the womb (contractions) that usually go away with rest, changing position, or drinking water. These are called Braxton Hicks contractions. Contractions may last for hours, days, or even weeks before true labor sets in. If contractions come at regular intervals, become more frequent, increase in intensity, or become painful, you should see your health care provider. What are the signs of labor?  Abdominal cramps.  Regular contractions that start at 10 minutes apart and become stronger and more frequent with time.  Contractions that start on the top of the uterus and spread down to the lower abdomen and back.  Increased pelvic pressure and dull back pain.  A watery or bloody mucus discharge that comes from the vagina.  Leaking of amniotic fluid. This is also known as your "water breaking." It could be a slow trickle or a gush. Let your health care provider know if it has a color or strange odor. If you have any of these signs, call your health care provider right away, even if it is before your due date. Follow these instructions at home: Medicines  Follow your health care provider's instructions regarding medicine use. Specific medicines may be either safe or unsafe to take during pregnancy.  Take a prenatal vitamin that contains at least 600 micrograms (mcg) of folic acid.  If you develop constipation, try taking a stool softener if your health care provider approves. Eating and drinking   Eat a balanced diet that includes fresh fruits and vegetables, whole grains, good sources of protein such as meat, eggs, or tofu,  and low-fat dairy. Your health care provider will help you determine the amount of weight gain that is right for you.  Avoid raw meat and uncooked cheese. These carry germs that can cause birth defects in the baby.  If you have low calcium intake from food, talk to your health care provider about whether you should take a daily calcium supplement.  Eat four or five small meals rather than three large meals a day.  Limit foods that are high in fat and processed sugars, such as fried and sweet foods.  To prevent constipation: ? Drink enough fluid to keep your urine clear or pale yellow. ? Eat foods that are high in fiber, such as fresh fruits and vegetables, whole grains, and beans. Activity  Exercise only as directed by your health care provider. Most women can continue their usual exercise routine during pregnancy. Try to exercise for 30 minutes at least 5 days a week. Stop exercising if you experience uterine contractions.  Avoid heavy lifting.  Do   not exercise in extreme heat or humidity, or at high altitudes.  Wear low-heel, comfortable shoes.  Practice good posture.  You may continue to have sex unless your health care provider tells you otherwise. Relieving pain and discomfort  Take frequent breaks and rest with your legs elevated if you have leg cramps or low back pain.  Take warm sitz baths to soothe any pain or discomfort caused by hemorrhoids. Use hemorrhoid cream if your health care provider approves.  Wear a good support bra to prevent discomfort from breast tenderness.  If you develop varicose veins: ? Wear support pantyhose or compression stockings as told by your healthcare provider. ? Elevate your feet for 15 minutes, 3-4 times a day. Prenatal care  Write down your questions. Take them to your prenatal visits.  Keep all your prenatal visits as told by your health care provider. This is important. Safety  Wear your seat belt at all times when driving.  Make  a list of emergency phone numbers, including numbers for family, friends, the hospital, and police and fire departments. General instructions  Avoid cat litter boxes and soil used by cats. These carry germs that can cause birth defects in the baby. If you have a cat, ask someone to clean the litter box for you.  Do not travel far distances unless it is absolutely necessary and only with the approval of your health care provider.  Do not use hot tubs, steam rooms, or saunas.  Do not drink alcohol.  Do not use any products that contain nicotine or tobacco, such as cigarettes and e-cigarettes. If you need help quitting, ask your health care provider.  Do not use any medicinal herbs or unprescribed drugs. These chemicals affect the formation and growth of the baby.  Do not douche or use tampons or scented sanitary pads.  Do not cross your legs for long periods of time.  To prepare for the arrival of your baby: ? Take prenatal classes to understand, practice, and ask questions about labor and delivery. ? Make a trial run to the hospital. ? Visit the hospital and tour the maternity area. ? Arrange for maternity or paternity leave through employers. ? Arrange for family and friends to take care of pets while you are in the hospital. ? Purchase a rear-facing car seat and make sure you know how to install it in your car. ? Pack your hospital bag. ? Prepare the baby's nursery. Make sure to remove all pillows and stuffed animals from the baby's crib to prevent suffocation.  Visit your dentist if you have not gone during your pregnancy. Use a soft toothbrush to brush your teeth and be gentle when you floss. Contact a health care provider if:  You are unsure if you are in labor or if your water has broken.  You become dizzy.  You have mild pelvic cramps, pelvic pressure, or nagging pain in your abdominal area.  You have lower back pain.  You have persistent nausea, vomiting, or  diarrhea.  You have an unusual or bad smelling vaginal discharge.  You have pain when you urinate. Get help right away if:  Your water breaks before 37 weeks.  You have regular contractions less than 5 minutes apart before 37 weeks.  You have a fever.  You are leaking fluid from your vagina.  You have spotting or bleeding from your vagina.  You have severe abdominal pain or cramping.  You have rapid weight loss or weight gain.  You have   shortness of breath with chest pain.  You notice sudden or extreme swelling of your face, hands, ankles, feet, or legs.  Your baby makes fewer than 10 movements in 2 hours.  You have severe headaches that do not go away when you take medicine.  You have vision changes. Summary  The third trimester is from week 28 through week 40, months 7 through 9. The third trimester is a time when the unborn baby (fetus) is growing rapidly.  During the third trimester, your discomfort may increase as you and your baby continue to gain weight. You may have abdominal, leg, and back pain, sleeping problems, and an increased need to urinate.  During the third trimester your breasts will keep growing and they will continue to become tender. A yellow fluid (colostrum) may leak from your breasts. This is the first milk you are producing for your baby.  False labor is a condition in which you feel small, irregular tightenings of the muscles in the womb (contractions) that eventually go away. These are called Braxton Hicks contractions. Contractions may last for hours, days, or even weeks before true labor sets in.  Signs of labor can include: abdominal cramps; regular contractions that start at 10 minutes apart and become stronger and more frequent with time; watery or bloody mucus discharge that comes from the vagina; increased pelvic pressure and dull back pain; and leaking of amniotic fluid. This information is not intended to replace advice given to you by your  health care provider. Make sure you discuss any questions you have with your health care provider. Document Released: 06/07/2001 Document Revised: 07/19/2016 Document Reviewed: 07/19/2016 [employer] Interactive Patient Education  2019 [employer]  

## 2018-12-14 LAB — CBC
Hematocrit: 33 % — ABNORMAL LOW (ref 34.0–46.6)
Hemoglobin: 11.2 g/dL (ref 11.1–15.9)
MCH: 27.8 pg (ref 26.6–33.0)
MCHC: 33.9 g/dL (ref 31.5–35.7)
MCV: 82 fL (ref 79–97)
Platelets: 221 10*3/uL (ref 150–450)
RBC: 4.03 x10E6/uL (ref 3.77–5.28)
RDW: 14.1 % (ref 11.7–15.4)
WBC: 7.6 10*3/uL (ref 3.4–10.8)

## 2018-12-14 LAB — GLUCOSE TOLERANCE, 2 HOURS W/ 1HR
Glucose, 1 hour: 145 mg/dL (ref 65–179)
Glucose, 2 hour: 100 mg/dL (ref 65–152)
Glucose, Fasting: 74 mg/dL (ref 65–91)

## 2018-12-14 LAB — RPR: RPR Ser Ql: NONREACTIVE

## 2018-12-14 LAB — HIV ANTIBODY (ROUTINE TESTING W REFLEX): HIV Screen 4th Generation wRfx: NONREACTIVE

## 2018-12-19 ENCOUNTER — Ambulatory Visit (HOSPITAL_COMMUNITY)
Admission: RE | Admit: 2018-12-19 | Discharge: 2018-12-19 | Disposition: A | Discharge status: Home / Self Care | Payer: Medicaid Other | Source: Ambulatory Visit | Attending: Obstetrics and Gynecology | Admitting: Obstetrics and Gynecology

## 2018-12-19 ENCOUNTER — Other Ambulatory Visit: Payer: Self-pay

## 2018-12-19 DIAGNOSIS — Z362 Encounter for other antenatal screening follow-up: Secondary | ICD-10-CM

## 2018-12-19 DIAGNOSIS — I472 Ventricular tachycardia: Secondary | ICD-10-CM | POA: Diagnosis present

## 2018-12-19 DIAGNOSIS — O3413 Maternal care for benign tumor of corpus uteri, third trimester: Secondary | ICD-10-CM

## 2018-12-19 DIAGNOSIS — I4729 Other ventricular tachycardia: Secondary | ICD-10-CM

## 2018-12-19 DIAGNOSIS — Z862 Personal history of diseases of the blood and blood-forming organs and certain disorders involving the immune mechanism: Secondary | ICD-10-CM

## 2018-12-19 DIAGNOSIS — Z3A29 29 weeks gestation of pregnancy: Secondary | ICD-10-CM

## 2018-12-19 DIAGNOSIS — O99213 Obesity complicating pregnancy, third trimester: Secondary | ICD-10-CM | POA: Diagnosis not present

## 2019-01-11 ENCOUNTER — Other Ambulatory Visit: Payer: Self-pay

## 2019-01-11 ENCOUNTER — Ambulatory Visit (INDEPENDENT_AMBULATORY_CARE_PROVIDER_SITE_OTHER): Payer: Medicaid Other | Admitting: Obstetrics & Gynecology

## 2019-01-11 VITALS — BP 110/59 | HR 90 | Wt 264.1 lb

## 2019-01-11 DIAGNOSIS — Z3A32 32 weeks gestation of pregnancy: Secondary | ICD-10-CM

## 2019-01-11 DIAGNOSIS — O99210 Obesity complicating pregnancy, unspecified trimester: Secondary | ICD-10-CM

## 2019-01-11 DIAGNOSIS — Z3400 Encounter for supervision of normal first pregnancy, unspecified trimester: Secondary | ICD-10-CM

## 2019-01-11 DIAGNOSIS — O99213 Obesity complicating pregnancy, third trimester: Secondary | ICD-10-CM

## 2019-01-11 DIAGNOSIS — D573 Sickle-cell trait: Secondary | ICD-10-CM

## 2019-01-11 MED ORDER — AMBULATORY NON FORMULARY MEDICATION: 1.0000 | 0 refills | Status: DC

## 2019-01-11 NOTE — Patient Instructions (Signed)
Contraception Choices Contraception, also called birth control, refers to methods or devices that prevent pregnancy. Hormonal methods Contraceptive implant  A contraceptive implant is a thin, plastic tube that contains a hormone. It is inserted into the upper part of the arm. It can remain in place for up to 3 years. Progestin-only injections Progestin-only injections are injections of progestin, a synthetic form of the hormone progesterone. They are given every 3 months by a health care provider. Birth control pills  Birth control pills are pills that contain hormones that prevent pregnancy. They must be taken once a day, preferably at the same time each day. Birth control patch  The birth control patch contains hormones that prevent pregnancy. It is placed on the skin and must be changed once a week for three weeks and removed on the fourth week. A prescription is needed to use this method of contraception. Vaginal ring  A vaginal ring contains hormones that prevent pregnancy. It is placed in the vagina for three weeks and removed on the fourth week. After that, the process is repeated with a new ring. A prescription is needed to use this method of contraception. Emergency contraceptive Emergency contraceptives prevent pregnancy after unprotected sex. They come in pill form and can be taken up to 5 days after sex. They work best the sooner they are taken after having sex. Most emergency contraceptives are available without a prescription. This method should not be used as your only form of birth control. Barrier methods Female condom  A female condom is a thin sheath that is worn over the penis during sex. Condoms keep sperm from going inside a woman's body. They can be used with a spermicide to increase their effectiveness. They should be disposed after a single use. Female condom  A female condom is a soft, loose-fitting sheath that is put into the vagina before sex. The condom keeps sperm  from going inside a woman's body. They should be disposed after a single use. Diaphragm  A diaphragm is a soft, dome-shaped barrier. It is inserted into the vagina before sex, along with a spermicide. The diaphragm blocks sperm from entering the uterus, and the spermicide kills sperm. A diaphragm should be left in the vagina for 6-8 hours after sex and removed within 24 hours. A diaphragm is prescribed and fitted by a health care provider. A diaphragm should be replaced every 1-2 years, after giving birth, after gaining more than 15 lb (6.8 kg), and after pelvic surgery. Cervical cap  A cervical cap is a round, soft latex or plastic cup that fits over the cervix. It is inserted into the vagina before sex, along with spermicide. It blocks sperm from entering the uterus. The cap should be left in place for 6-8 hours after sex and removed within 48 hours. A cervical cap must be prescribed and fitted by a health care provider. It should be replaced every 2 years. Sponge  A sponge is a soft, circular piece of polyurethane foam with spermicide on it. The sponge helps block sperm from entering the uterus, and the spermicide kills sperm. To use it, you make it wet and then insert it into the vagina. It should be inserted before sex, left in for at least 6 hours after sex, and removed and thrown away within 30 hours. Spermicides Spermicides are chemicals that kill or block sperm from entering the cervix and uterus. They can come as a cream, jelly, suppository, foam, or tablet. A spermicide should be inserted into the   vagina with an applicator at least 10-15 minutes before sex to allow time for it to work. The process must be repeated every time you have sex. Spermicides do not require a prescription. Intrauterine contraception Intrauterine device (IUD) An IUD is a T-shaped device that is put in a woman's uterus. There are two types:  Hormone IUD.This type contains progestin, a synthetic form of the hormone  progesterone. This type can stay in place for 3-5 years.  Copper IUD.This type is wrapped in copper wire. It can stay in place for 10 years.  Permanent methods of contraception Female tubal ligation In this method, a woman's fallopian tubes are sealed, tied, or blocked during surgery to prevent eggs from traveling to the uterus. Hysteroscopic sterilization In this method, a small, flexible insert is placed into each fallopian tube. The inserts cause scar tissue to form in the fallopian tubes and block them, so sperm cannot reach an egg. The procedure takes about 3 months to be effective. Another form of birth control must be used during those 3 months. Female sterilization This is a procedure to tie off the tubes that carry sperm (vasectomy). After the procedure, the man can still ejaculate fluid (semen). Natural planning methods Natural family planning In this method, a couple does not have sex on days when the woman could become pregnant. Calendar method This means keeping track of the length of each menstrual cycle, identifying the days when pregnancy can happen, and not having sex on those days. Ovulation method In this method, a couple avoids sex during ovulation. Symptothermal method This method involves not having sex during ovulation. The woman typically checks for ovulation by watching changes in her temperature and in the consistency of cervical mucus. Post-ovulation method In this method, a couple waits to have sex until after ovulation. Summary  Contraception, also called birth control, means methods or devices that prevent pregnancy.  Hormonal methods of contraception include implants, injections, pills, patches, vaginal rings, and emergency contraceptives.  Barrier methods of contraception can include female condoms, female condoms, diaphragms, cervical caps, sponges, and spermicides.  There are two types of IUDs (intrauterine devices). An IUD can be put in a woman's uterus to  prevent pregnancy for 3-5 years.  Permanent sterilization can be done through a procedure for males, females, or both.  Natural family planning methods involve not having sex on days when the woman could become pregnant. This information is not intended to replace advice given to you by your health care provider. Make sure you discuss any questions you have with your health care provider. Document Released: 06/13/2005 Document Revised: 06/15/2017 Document Reviewed: 07/16/2016 Elsevier Patient Education  2020 [employer] Third Trimester of Pregnancy The third trimester is from week 28 through week 40 (months 7 through 9). The third trimester is a time when the unborn baby (fetus) is growing rapidly. At the end of the ninth month, the fetus is about 20 inches in length and weighs 6-10 pounds. Body changes during your third trimester Your body will continue to go through many changes during pregnancy. The changes vary from woman to woman. During the third trimester:  Your weight will continue to increase. You can expect to gain 25-35 pounds (11-16 kg) by the end of the pregnancy.  You may begin to get stretch marks on your hips, abdomen, and breasts.  You may urinate more often because the fetus is moving lower into your pelvis and pressing on your bladder.  You may develop or continue to have  heartburn. This is caused by increased hormones that slow down muscles in the digestive tract.  You may develop or continue to have constipation because increased hormones slow digestion and cause the muscles that push waste through your intestines to relax.  You may develop hemorrhoids. These are swollen veins (varicose veins) in the rectum that can itch or be painful.  You may develop swollen, bulging veins (varicose veins) in your legs.  You may have increased body aches in the pelvis, back, or thighs. This is due to weight gain and increased hormones that are relaxing your joints.  You may have  changes in your hair. These can include thickening of your hair, rapid growth, and changes in texture. Some women also have hair loss during or after pregnancy, or hair that feels dry or thin. Your hair will most likely return to normal after your baby is born.  Your breasts will continue to grow and they will continue to become tender. A yellow fluid (colostrum) may leak from your breasts. This is the first milk you are producing for your baby.  Your belly button may stick out.  You may notice more swelling in your hands, face, or ankles.  You may have increased tingling or numbness in your hands, arms, and legs. The skin on your belly may also feel numb.  You may feel short of breath because of your expanding uterus.  You may have more problems sleeping. This can be caused by the size of your belly, increased need to urinate, and an increase in your body's metabolism.  You may notice the fetus "dropping," or moving lower in your abdomen (lightening).  You may have increased vaginal discharge.  You may notice your joints feel loose and you may have pain around your pelvic bone. What to expect at prenatal visits You will have prenatal exams every 2 weeks until week 36. Then you will have weekly prenatal exams. During a routine prenatal visit:  You will be weighed to make sure you and the baby are growing normally.  Your blood pressure will be taken.  Your abdomen will be measured to track your baby's growth.  The fetal heartbeat will be listened to.  Any test results from the previous visit will be discussed.  You may have a cervical check near your due date to see if your cervix has softened or thinned (effaced).  You will be tested for Group B streptococcus. This happens between 35 and 37 weeks. Your health care provider may ask you:  What your birth plan is.  How you are feeling.  If you are feeling the baby move.  If you have had any abnormal symptoms, such as leaking  fluid, bleeding, severe headaches, or abdominal cramping.  If you are using any tobacco products, including cigarettes, chewing tobacco, and electronic cigarettes.  If you have any questions. Other tests or screenings that may be performed during your third trimester include:  Blood tests that check for low iron levels (anemia).  Fetal testing to check the health, activity level, and growth of the fetus. Testing is done if you have certain medical conditions or if there are problems during the pregnancy.  Nonstress test (NST). This test checks the health of your baby to make sure there are no signs of problems, such as the baby not getting enough oxygen. During this test, a belt is placed around your belly. The baby is made to move, and its heart rate is monitored during movement. What is false  labor? False labor is a condition in which you feel small, irregular tightenings of the muscles in the womb (contractions) that usually go away with rest, changing position, or drinking water. These are called Braxton Hicks contractions. Contractions may last for hours, days, or even weeks before true labor sets in. If contractions come at regular intervals, become more frequent, increase in intensity, or become painful, you should see your health care provider. What are the signs of labor?  Abdominal cramps.  Regular contractions that start at 10 minutes apart and become stronger and more frequent with time.  Contractions that start on the top of the uterus and spread down to the lower abdomen and back.  Increased pelvic pressure and dull back pain.  A watery or bloody mucus discharge that comes from the vagina.  Leaking of amniotic fluid. This is also known as your "water breaking." It could be a slow trickle or a gush. Let your health care provider know if it has a color or strange odor. If you have any of these signs, call your health care provider right away, even if it is before your due date.  Follow these instructions at home: Medicines  Follow your health care provider's instructions regarding medicine use. Specific medicines may be either safe or unsafe to take during pregnancy.  Take a prenatal vitamin that contains at least 600 micrograms (mcg) of folic acid.  If you develop constipation, try taking a stool softener if your health care provider approves. Eating and drinking   Eat a balanced diet that includes fresh fruits and vegetables, whole grains, good sources of protein such as meat, eggs, or tofu, and low-fat dairy. Your health care provider will help you determine the amount of weight gain that is right for you.  Avoid raw meat and uncooked cheese. These carry germs that can cause birth defects in the baby.  If you have low calcium intake from food, talk to your health care provider about whether you should take a daily calcium supplement.  Eat four or five small meals rather than three large meals a day.  Limit foods that are high in fat and processed sugars, such as fried and sweet foods.  To prevent constipation: ? Drink enough fluid to keep your urine clear or pale yellow. ? Eat foods that are high in fiber, such as fresh fruits and vegetables, whole grains, and beans. Activity  Exercise only as directed by your health care provider. Most women can continue their usual exercise routine during pregnancy. Try to exercise for 30 minutes at least 5 days a week. Stop exercising if you experience uterine contractions.  Avoid heavy lifting.  Do not exercise in extreme heat or humidity, or at high altitudes.  Wear low-heel, comfortable shoes.  Practice good posture.  You may continue to have sex unless your health care provider tells you otherwise. Relieving pain and discomfort  Take frequent breaks and rest with your legs elevated if you have leg cramps or low back pain.  Take warm sitz baths to soothe any pain or discomfort caused by hemorrhoids. Use  hemorrhoid cream if your health care provider approves.  Wear a good support bra to prevent discomfort from breast tenderness.  If you develop varicose veins: ? Wear support pantyhose or compression stockings as told by your healthcare provider. ? Elevate your feet for 15 minutes, 3-4 times a day. Prenatal care  Write down your questions. Take them to your prenatal visits.  Keep all your prenatal visits  as told by your health care provider. This is important. Safety  Wear your seat belt at all times when driving.  Make a list of emergency phone numbers, including numbers for family, friends, the hospital, and police and fire departments. General instructions  Avoid cat litter boxes and soil used by cats. These carry germs that can cause birth defects in the baby. If you have a cat, ask someone to clean the litter box for you.  Do not travel far distances unless it is absolutely necessary and only with the approval of your health care provider.  Do not use hot tubs, steam rooms, or saunas.  Do not drink alcohol.  Do not use any products that contain nicotine or tobacco, such as cigarettes and e-cigarettes. If you need help quitting, ask your health care provider.  Do not use any medicinal herbs or unprescribed drugs. These chemicals affect the formation and growth of the baby.  Do not douche or use tampons or scented sanitary pads.  Do not cross your legs for long periods of time.  To prepare for the arrival of your baby: ? Take prenatal classes to understand, practice, and ask questions about labor and delivery. ? Make a trial run to the hospital. ? Visit the hospital and tour the maternity area. ? Arrange for maternity or paternity leave through employers. ? Arrange for family and friends to take care of pets while you are in the hospital. ? Purchase a rear-facing car seat and make sure you know how to install it in your car. ? Pack your hospital bag. ? Prepare the baby's  nursery. Make sure to remove all pillows and stuffed animals from the baby's crib to prevent suffocation.  Visit your dentist if you have not gone during your pregnancy. Use a soft toothbrush to brush your teeth and be gentle when you floss. Contact a health care provider if:  You are unsure if you are in labor or if your water has broken.  You become dizzy.  You have mild pelvic cramps, pelvic pressure, or nagging pain in your abdominal area.  You have lower back pain.  You have persistent nausea, vomiting, or diarrhea.  You have an unusual or bad smelling vaginal discharge.  You have pain when you urinate. Get help right away if:  Your water breaks before 37 weeks.  You have regular contractions less than 5 minutes apart before 37 weeks.  You have a fever.  You are leaking fluid from your vagina.  You have spotting or bleeding from your vagina.  You have severe abdominal pain or cramping.  You have rapid weight loss or weight gain.  You have shortness of breath with chest pain.  You notice sudden or extreme swelling of your face, hands, ankles, feet, or legs.  Your baby makes fewer than 10 movements in 2 hours.  You have severe headaches that do not go away when you take medicine.  You have vision changes. Summary  The third trimester is from week 28 through week 40, months 7 through 9. The third trimester is a time when the unborn baby (fetus) is growing rapidly.  During the third trimester, your discomfort may increase as you and your baby continue to gain weight. You may have abdominal, leg, and back pain, sleeping problems, and an increased need to urinate.  During the third trimester your breasts will keep growing and they will continue to become tender. A yellow fluid (colostrum) may leak from your breasts. This is  the first milk you are producing for your baby.  False labor is a condition in which you feel small, irregular tightenings of the muscles in the  womb (contractions) that eventually go away. These are called Braxton Hicks contractions. Contractions may last for hours, days, or even weeks before true labor sets in.  Signs of labor can include: abdominal cramps; regular contractions that start at 10 minutes apart and become stronger and more frequent with time; watery or bloody mucus discharge that comes from the vagina; increased pelvic pressure and dull back pain; and leaking of amniotic fluid. This information is not intended to replace advice given to you by your health care provider. Make sure you discuss any questions you have with your health care provider. Document Released: 06/07/2001 Document Revised: 10/04/2018 Document Reviewed: 07/19/2016 Elsevier Patient Education  2020 [employer].

## 2019-01-11 NOTE — Addendum Note (Signed)
Addended by: Wendelyn Breslow L on: 01/11/2019 10:58 AM   Modules accepted: Orders

## 2019-01-11 NOTE — Progress Notes (Signed)
   PRENATAL VISIT NOTE  Subjective:  Morgan Carlson is a 23 y.o. G1P0 at [redacted]w[redacted]d being seen today for ongoing prenatal care.  She is currently monitored for the following issues for this high-risk pregnancy and has Supervision of normal first pregnancy, antepartum; Sickle cell trait ([place]); Eczema; and Obesity in pregnancy, antepartum on their problem list.  Patient reports no complaints.  Contractions: Not present. Vag. Bleeding: None.  Movement: Present. Denies leaking of fluid.   The following portions of the patient's history were reviewed and updated as appropriate: allergies, current medications, past family history, past medical history, past social history, past surgical history and problem list.   Objective:   Vitals:   01/11/19 1005  BP: (!) 110/59  Pulse: 90  Weight: 264 lb 1.9 oz (119.8 kg)    Fetal Status: Fetal Heart Rate (bpm): 156   Movement: Present     General:  Alert, oriented and cooperative. Patient is in no acute distress.  Skin: Skin is warm and dry. No rash noted.   Cardiovascular: Normal heart rate noted  Respiratory: Normal respiratory effort, no problems with respiration noted  Abdomen: Soft, gravid, appropriate for gestational age.  Pain/Pressure: Absent     Pelvic: Cervical exam deferred        Extremities: Normal range of motion.  Edema: Trace  Mental Status: Normal mood and affect. Normal behavior. Normal judgment and thought content.   Assessment and Plan:  Pregnancy: G1P0 at [redacted]w[redacted]d 1. Supervision of normal first pregnancy, antepartum FH and FHR WNL  2. Sickle cell trait ([place])  3. Obesity in pregnancy, antepartum  Preterm labor symptoms and general obstetric precautions including but not limited to vaginal bleeding, contractions, leaking of fluid and fetal movement were reviewed in detail with the patient. Please refer to After Visit Summary for other counseling recommendations.   Return in about 4 weeks (around 02/08/2019) for in person.  No  future appointments.  Lavonia Drafts, MD

## 2019-01-24 ENCOUNTER — Encounter: Payer: Self-pay | Admitting: Family Medicine

## 2019-01-24 ENCOUNTER — Ambulatory Visit (INDEPENDENT_AMBULATORY_CARE_PROVIDER_SITE_OTHER): Payer: Medicaid Other | Admitting: Family Medicine

## 2019-01-24 DIAGNOSIS — Z3400 Encounter for supervision of normal first pregnancy, unspecified trimester: Secondary | ICD-10-CM

## 2019-01-24 DIAGNOSIS — O99213 Obesity complicating pregnancy, third trimester: Secondary | ICD-10-CM

## 2019-01-24 DIAGNOSIS — O99210 Obesity complicating pregnancy, unspecified trimester: Secondary | ICD-10-CM

## 2019-01-24 DIAGNOSIS — D573 Sickle-cell trait: Secondary | ICD-10-CM

## 2019-01-24 DIAGNOSIS — O99013 Anemia complicating pregnancy, third trimester: Secondary | ICD-10-CM

## 2019-01-24 DIAGNOSIS — Z3A34 34 weeks gestation of pregnancy: Secondary | ICD-10-CM

## 2019-01-24 NOTE — Progress Notes (Signed)
   TELEHEALTH OBSTETRICS PRENATAL VIRTUAL VIDEO VISIT ENCOUNTER NOTE  Provider location: [facility] [place] [facility]   I connected with Morgan Carlson on 01/24/19 at  2:30 PM EDT by WebEx Video Encounter at home and verified that I am speaking with the correct person using two identifiers.   I discussed the limitations, risks, security and privacy concerns of performing an evaluation and management service virtually and the availability of in person appointments. I also discussed with the patient that there may be a patient responsible charge related to this service. The patient expressed understanding and agreed to proceed. Subjective:  Morgan Carlson is a 23 y.o. G1P0 at [redacted]w[redacted]d being seen today for ongoing prenatal care.  She is currently monitored for the following issues for this low-risk pregnancy and has Supervision of normal first pregnancy, antepartum; Sickle cell trait ([place]); Eczema; and Obesity in pregnancy, antepartum on their problem list.  Patient reports occasional contractions.  Contractions: Not present. Vag. Bleeding: None.  Movement: Present. Denies any leaking of fluid.   The following portions of the patient's history were reviewed and updated as appropriate: allergies, current medications, past family history, past medical history, past social history, past surgical history and problem list.   Objective:  There were no vitals filed for this visit.  Fetal Status:     Movement: Present     General:  Alert, oriented and cooperative. Patient is in no acute distress.  Respiratory: Normal respiratory effort, no problems with respiration noted  Mental Status: Normal mood and affect. Normal behavior. Normal judgment and thought content.  Rest of physical exam deferred due to type of encounter  Imaging: No results found.  Assessment and Plan:  Pregnancy: G1P0 at [redacted]w[redacted]d 1. Supervision of normal first pregnancy, antepartum FHT and FH normal  2. Sickle  cell trait ([place])  3. Obesity in pregnancy, antepartum  Preterm labor symptoms and general obstetric precautions including but not limited to vaginal bleeding, contractions, leaking of fluid and fetal movement were reviewed in detail with the patient. I discussed the assessment and treatment plan with the patient. The patient was provided an opportunity to ask questions and all were answered. The patient agreed with the plan and demonstrated an understanding of the instructions. The patient was advised to call back or seek an in-person office evaluation/go to [facility] at [facility] for any urgent or concerning symptoms. Please refer to After Visit Summary for other counseling recommendations.   I provided 9 minutes of face-to-face time during this encounter.  Return in about 2 weeks (around 02/07/2019) for OB f/u, In Office.  Future Appointments  Date Time Provider [place]  02/13/2019  2:00 PM Emily Filbert, MD [facility] None    [place], [place] [facility] [employer], [place]

## 2019-01-24 NOTE — Progress Notes (Signed)
Patient agrees to webex meeting. Kathrene Alu Rn

## 2019-02-13 ENCOUNTER — Other Ambulatory Visit (HOSPITAL_COMMUNITY)
Admission: RE | Admit: 2019-02-13 | Discharge: 2019-02-13 | Disposition: A | Discharge status: Home / Self Care | Payer: Medicaid Other | Source: Ambulatory Visit | Attending: Obstetrics & Gynecology | Admitting: Obstetrics & Gynecology

## 2019-02-13 ENCOUNTER — Ambulatory Visit (INDEPENDENT_AMBULATORY_CARE_PROVIDER_SITE_OTHER): Payer: Medicaid Other | Admitting: Obstetrics & Gynecology

## 2019-02-13 ENCOUNTER — Other Ambulatory Visit: Payer: Self-pay

## 2019-02-13 VITALS — BP 120/72 | HR 83 | Wt 273.0 lb

## 2019-02-13 DIAGNOSIS — O99013 Anemia complicating pregnancy, third trimester: Secondary | ICD-10-CM

## 2019-02-13 DIAGNOSIS — O99210 Obesity complicating pregnancy, unspecified trimester: Secondary | ICD-10-CM

## 2019-02-13 DIAGNOSIS — O99213 Obesity complicating pregnancy, third trimester: Secondary | ICD-10-CM

## 2019-02-13 DIAGNOSIS — Z3400 Encounter for supervision of normal first pregnancy, unspecified trimester: Secondary | ICD-10-CM | POA: Diagnosis present

## 2019-02-13 DIAGNOSIS — D573 Sickle-cell trait: Secondary | ICD-10-CM

## 2019-02-13 DIAGNOSIS — Z3A37 37 weeks gestation of pregnancy: Secondary | ICD-10-CM

## 2019-02-13 LAB — POCT URINALYSIS DIPSTICK OB: Glucose, UA: NEGATIVE

## 2019-02-13 NOTE — Progress Notes (Signed)
   PRENATAL VISIT NOTE  Subjective:  Morgan Carlson is a 23 y.o. G1P0 at [redacted]w[redacted]d being seen today for ongoing prenatal care.  She is currently monitored for the following issues for this low-risk pregnancy and has Supervision of normal first pregnancy, antepartum; Sickle cell trait ([place]); Eczema; and Obesity in pregnancy, antepartum on their problem list.  Patient reports no complaints.  Contractions: Irritability. Vag. Bleeding: None.  Movement: Present. Denies leaking of fluid.   The following portions of the patient's history were reviewed and updated as appropriate: allergies, current medications, past family history, past medical history, past social history, past surgical history and problem list.   Objective:   Vitals:   02/13/19 1405  BP: 120/72  Pulse: 83  Weight: 273 lb (123.8 kg)    Fetal Status: Fetal Heart Rate (bpm): 145   Movement: Present     General:  Alert, oriented and cooperative. Patient is in no acute distress.  Skin: Skin is warm and dry. No rash noted.   Cardiovascular: Normal heart rate noted  Respiratory: Normal respiratory effort, no problems with respiration noted  Abdomen: Soft, gravid, appropriate for gestational age.  Pain/Pressure: Present     Pelvic: Cervical exam performed        Extremities: Normal range of motion.  Edema: Trace  Mental Status: Normal mood and affect. Normal behavior. Normal judgment and thought content.   Assessment and Plan:  Pregnancy: G1P0 at [redacted]w[redacted]d 1. Supervision of normal first pregnancy, antepartum  - Culture, beta strep (group b only) - GC/Chlamydia probe amp ([place])not at [facility] - POC Urinalysis Dipstick OB  2. Sickle cell trait ([facility])  - POC Urinalysis Dipstick OB  3. Obesity in pregnancy, antepartum  - Culture, beta strep (group b only) - GC/Chlamydia probe amp ([place])not at [facility] - POC Urinalysis Dipstick OB  Preterm labor symptoms and general obstetric precautions including but not limited to vaginal  bleeding, contractions, leaking of fluid and fetal movement were reviewed in detail with the patient. Please refer to After Visit Summary for other counseling recommendations.   Come back 1 week. She prefers in person visits.  No future appointments.  Emily Filbert, MD

## 2019-02-14 LAB — GC/CHLAMYDIA PROBE AMP (~~LOC~~) NOT AT ARMC
Chlamydia: NEGATIVE
Neisseria Gonorrhea: NEGATIVE

## 2019-02-17 LAB — CULTURE, BETA STREP (GROUP B ONLY): Strep Gp B Culture: NEGATIVE

## 2019-02-22 ENCOUNTER — Other Ambulatory Visit: Payer: Self-pay

## 2019-02-22 ENCOUNTER — Encounter: Payer: Self-pay | Admitting: Family Medicine

## 2019-02-22 ENCOUNTER — Ambulatory Visit (INDEPENDENT_AMBULATORY_CARE_PROVIDER_SITE_OTHER): Payer: Medicaid Other | Admitting: Family Medicine

## 2019-02-22 VITALS — BP 124/70 | HR 93 | Wt 282.0 lb

## 2019-02-22 DIAGNOSIS — O99113 Other diseases of the blood and blood-forming organs and certain disorders involving the immune mechanism complicating pregnancy, third trimester: Secondary | ICD-10-CM

## 2019-02-22 DIAGNOSIS — O99210 Obesity complicating pregnancy, unspecified trimester: Secondary | ICD-10-CM

## 2019-02-22 DIAGNOSIS — D573 Sickle-cell trait: Secondary | ICD-10-CM

## 2019-02-22 DIAGNOSIS — Z3400 Encounter for supervision of normal first pregnancy, unspecified trimester: Secondary | ICD-10-CM

## 2019-02-22 DIAGNOSIS — O99213 Obesity complicating pregnancy, third trimester: Secondary | ICD-10-CM

## 2019-02-22 DIAGNOSIS — Z3A38 38 weeks gestation of pregnancy: Secondary | ICD-10-CM

## 2019-02-22 NOTE — Progress Notes (Signed)
   PRENATAL VISIT NOTE  Subjective:  Morgan Carlson is a 23 y.o. G1P0 at [redacted]w[redacted]d being seen today for ongoing prenatal care.  She is currently monitored for the following issues for this low-risk pregnancy and has Supervision of normal first pregnancy, antepartum; Sickle cell trait ([place]); Eczema; and Obesity in pregnancy, antepartum on their problem list.  Patient reports occasional contractions.  Contractions: Irregular. Vag. Bleeding: None.  Movement: Present. Denies leaking of fluid.   The following portions of the patient's history were reviewed and updated as appropriate: allergies, current medications, past family history, past medical history, past social history, past surgical history and problem list.   Objective:   Vitals:   02/22/19 0902  BP: 124/70  Pulse: 93  Weight: 282 lb (127.9 kg)    Fetal Status: Fetal Heart Rate (bpm): 150   Movement: Present     General:  Alert, oriented and cooperative. Patient is in no acute distress.  Skin: Skin is warm and dry. No rash noted.   Cardiovascular: Normal heart rate noted  Respiratory: Normal respiratory effort, no problems with respiration noted  Abdomen: Soft, gravid, appropriate for gestational age.  Pain/Pressure: Present     Pelvic: Cervical exam deferred        Extremities: Normal range of motion.  Edema: Trace  Mental Status: Normal mood and affect. Normal behavior. Normal judgment and thought content.   Assessment and Plan:  Pregnancy: G1P0 at [redacted]w[redacted]d 1. Supervision of normal first pregnancy, antepartum FHT and FH normal  2. Sickle cell trait ([place])  3. Obesity in pregnancy, antepartum   Term labor symptoms and general obstetric precautions including but not limited to vaginal bleeding, contractions, leaking of fluid and fetal movement were reviewed in detail with the patient. Please refer to After Visit Summary for other counseling recommendations.   Return in about 1 week (around 03/01/2019) for OB f/u, In Office.  No  future appointments.  Truett Mainland, DO

## 2019-02-28 ENCOUNTER — Encounter (HOSPITAL_COMMUNITY): Payer: Self-pay | Admitting: *Deleted

## 2019-02-28 ENCOUNTER — Telehealth (HOSPITAL_COMMUNITY): Payer: Self-pay | Admitting: *Deleted

## 2019-02-28 ENCOUNTER — Ambulatory Visit (INDEPENDENT_AMBULATORY_CARE_PROVIDER_SITE_OTHER): Payer: Medicaid Other | Admitting: Family Medicine

## 2019-02-28 ENCOUNTER — Other Ambulatory Visit: Payer: Self-pay

## 2019-02-28 VITALS — BP 123/80 | HR 95 | Wt 283.0 lb

## 2019-02-28 DIAGNOSIS — Z3A39 39 weeks gestation of pregnancy: Secondary | ICD-10-CM

## 2019-02-28 DIAGNOSIS — Z3403 Encounter for supervision of normal first pregnancy, third trimester: Secondary | ICD-10-CM

## 2019-02-28 DIAGNOSIS — Z3400 Encounter for supervision of normal first pregnancy, unspecified trimester: Secondary | ICD-10-CM

## 2019-02-28 NOTE — Progress Notes (Signed)
   PRENATAL VISIT NOTE  Subjective:  Morgan Carlson is a 23 y.o. G1P0 at [redacted]w[redacted]d being seen today for ongoing prenatal care.  She is currently monitored for the following issues for this low-risk pregnancy and has Supervision of normal first pregnancy, antepartum; Sickle cell trait ([place]); Eczema; and Obesity in pregnancy, antepartum on their problem list.  Patient reports no complaints.  Contractions: Irritability. Vag. Bleeding: None.  Movement: Present. Denies leaking of fluid.   The following portions of the patient's history were reviewed and updated as appropriate: allergies, current medications, past family history, past medical history, past social history, past surgical history and problem list.   Objective:   Vitals:   02/28/19 1350  BP: 123/80  Pulse: 95  Weight: 283 lb (128.4 kg)    Fetal Status: Fetal Heart Rate (bpm): 160 Fundal Height: 40 cm Movement: Present  Presentation: Vertex  General:  Alert, oriented and cooperative. Patient is in no acute distress.  Skin: Skin is warm and dry. No rash noted.   Cardiovascular: Normal heart rate noted  Respiratory: Normal respiratory effort, no problems with respiration noted  Abdomen: Soft, gravid, appropriate for gestational age.  Pain/Pressure: Present     Pelvic: Cervical exam performed Dilation: 1.5 Effacement (%): 60 Station: -2  Extremities: Normal range of motion.  Edema: Trace  Mental Status: Normal mood and affect. Normal behavior. Normal judgment and thought content.   Assessment and Plan:  Pregnancy: G1P0 at [redacted]w[redacted]d 1. Supervision of normal first pregnancy, antepartum FHT and FH normal. Induction scheduled.  Preterm labor symptoms and general obstetric precautions including but not limited to vaginal bleeding, contractions, leaking of fluid and fetal movement were reviewed in detail with the patient. Please refer to After Visit Summary for other counseling recommendations.   Return in about 5 weeks (around 04/04/2019)  for Postpartum Exam.  Future Appointments  Date Time Provider [place]  03/07/2019 12:00 AM [facility] [place] None  04/11/2019 10:00 AM Truett Mainland, DO [facility] None    Truett Mainland, DO

## 2019-02-28 NOTE — Telephone Encounter (Signed)
Preadmission screen  

## 2019-03-02 ENCOUNTER — Inpatient Hospital Stay (HOSPITAL_COMMUNITY)
Admission: AD | Admit: 2019-03-02 | Discharge: 2019-03-04 | DRG: 807 | Disposition: A | Discharge status: Home / Self Care | Payer: Medicaid Other | Attending: Family Medicine | Admitting: Family Medicine

## 2019-03-02 ENCOUNTER — Encounter (HOSPITAL_COMMUNITY): Payer: Self-pay | Admitting: *Deleted

## 2019-03-02 ENCOUNTER — Other Ambulatory Visit: Payer: Self-pay

## 2019-03-02 ENCOUNTER — Inpatient Hospital Stay (HOSPITAL_COMMUNITY): Payer: Medicaid Other | Admitting: Anesthesiology

## 2019-03-02 DIAGNOSIS — Z3A40 40 weeks gestation of pregnancy: Secondary | ICD-10-CM

## 2019-03-02 DIAGNOSIS — O9902 Anemia complicating childbirth: Secondary | ICD-10-CM | POA: Diagnosis present

## 2019-03-02 DIAGNOSIS — O26893 Other specified pregnancy related conditions, third trimester: Secondary | ICD-10-CM | POA: Diagnosis present

## 2019-03-02 DIAGNOSIS — Z3400 Encounter for supervision of normal first pregnancy, unspecified trimester: Secondary | ICD-10-CM

## 2019-03-02 DIAGNOSIS — D573 Sickle-cell trait: Secondary | ICD-10-CM | POA: Diagnosis present

## 2019-03-02 DIAGNOSIS — O99214 Obesity complicating childbirth: Secondary | ICD-10-CM | POA: Diagnosis present

## 2019-03-02 DIAGNOSIS — O99210 Obesity complicating pregnancy, unspecified trimester: Secondary | ICD-10-CM

## 2019-03-02 DIAGNOSIS — O134 Gestational [pregnancy-induced] hypertension without significant proteinuria, complicating childbirth: Principal | ICD-10-CM | POA: Diagnosis present

## 2019-03-02 DIAGNOSIS — Z3490 Encounter for supervision of normal pregnancy, unspecified, unspecified trimester: Secondary | ICD-10-CM | POA: Diagnosis present

## 2019-03-02 DIAGNOSIS — Z20828 Contact with and (suspected) exposure to other viral communicable diseases: Secondary | ICD-10-CM | POA: Diagnosis present

## 2019-03-02 HISTORY — DX: Other specified health status: Z78.9

## 2019-03-02 LAB — ABO/RH: ABO/RH(D): O POS

## 2019-03-02 LAB — TYPE AND SCREEN
ABO/RH(D): O POS
Antibody Screen: NEGATIVE

## 2019-03-02 LAB — COMPREHENSIVE METABOLIC PANEL
ALT: 13 U/L (ref 0–44)
AST: 21 U/L (ref 15–41)
Albumin: 2.8 g/dL — ABNORMAL LOW (ref 3.5–5.0)
Alkaline Phosphatase: 136 U/L — ABNORMAL HIGH (ref 38–126)
Anion gap: 9 (ref 5–15)
BUN: 5 mg/dL — ABNORMAL LOW (ref 6–20)
CO2: 21 mmol/L — ABNORMAL LOW (ref 22–32)
Calcium: 9.4 mg/dL (ref 8.9–10.3)
Chloride: 106 mmol/L (ref 98–111)
Creatinine, Ser: 0.73 mg/dL (ref 0.44–1.00)
GFR calc Af Amer: >60 mL/min (ref >60)
GFR calc non Af Amer: >60 mL/min (ref >60)
Glucose, Bld: 91 mg/dL (ref 70–99)
Potassium: 3.8 mmol/L (ref 3.5–5.1)
Sodium: 136 mmol/L (ref 135–145)
Total Bilirubin: 0.9 mg/dL (ref 0.3–1.2)
Total Protein: 6.3 g/dL — ABNORMAL LOW (ref 6.5–8.1)

## 2019-03-02 LAB — SARS CORONAVIRUS 2 BY RT PCR (HOSPITAL ORDER, PERFORMED IN ~~LOC~~ HOSPITAL LAB): SARS Coronavirus 2: NEGATIVE

## 2019-03-02 LAB — CBC
HCT: 35.2 % — ABNORMAL LOW (ref 36.0–46.0)
Hemoglobin: 11.5 g/dL — ABNORMAL LOW (ref 12.0–15.0)
MCH: 26.8 pg (ref 26.0–34.0)
MCHC: 32.7 g/dL (ref 30.0–36.0)
MCV: 82.1 fL (ref 80.0–100.0)
Platelets: 214 10*3/uL (ref 150–400)
RBC: 4.29 MIL/uL (ref 3.87–5.11)
RDW: 15.9 % — ABNORMAL HIGH (ref 11.5–15.5)
WBC: 10.6 10*3/uL — ABNORMAL HIGH (ref 4.0–10.5)
nRBC: 0 % (ref 0.0–0.2)

## 2019-03-02 LAB — PROTEIN / CREATININE RATIO, URINE
Creatinine, Urine: 138.09 mg/dL
Protein Creatinine Ratio: 0.12 mg/mg{Cre} (ref 0.00–0.15)
Total Protein, Urine: 16 mg/dL

## 2019-03-02 LAB — RPR: RPR Ser Ql: NONREACTIVE

## 2019-03-02 MED ORDER — EPHEDRINE 5 MG/ML INJ: 10.0000 mg | INTRAVENOUS | Status: DC | PRN

## 2019-03-02 MED ORDER — TERBUTALINE SULFATE 1 MG/ML IJ SOLN: 0.2500 mg | Freq: Once | INTRAMUSCULAR | Status: DC | PRN

## 2019-03-02 MED ORDER — PRENATAL MULTIVITAMIN CH
1.0000 | ORAL_TABLET | Freq: Every day | ORAL | Status: DC
  Administered 2019-03-02 – 2019-03-03 (×2): 1 via ORAL
  Filled 2019-03-02 (×2): qty 1

## 2019-03-02 MED ORDER — MAGNESIUM HYDROXIDE 400 MG/5ML PO SUSP: 30.0000 mL | ORAL | Status: DC | PRN

## 2019-03-02 MED ORDER — ACETAMINOPHEN 325 MG PO TABS: 650.0000 mg | ORAL_TABLET | ORAL | Status: DC | PRN

## 2019-03-02 MED ORDER — OXYTOCIN 40 UNITS IN NORMAL SALINE INFUSION - SIMPLE MED
2.5000 [IU]/h | INTRAVENOUS | Status: DC
  Filled 2019-03-02: qty 1000

## 2019-03-02 MED ORDER — DIPHENHYDRAMINE HCL 25 MG PO CAPS: 25.0000 mg | ORAL_CAPSULE | Freq: Four times a day (QID) | ORAL | Status: DC | PRN

## 2019-03-02 MED ORDER — BENZOCAINE-MENTHOL 20-0.5 % EX AERO
1.0000 "application " | INHALATION_SPRAY | CUTANEOUS | Status: DC | PRN
  Filled 2019-03-02: qty 56

## 2019-03-02 MED ORDER — WITCH HAZEL-GLYCERIN EX PADS: 1.0000 "application " | MEDICATED_PAD | CUTANEOUS | Status: DC | PRN

## 2019-03-02 MED ORDER — PHENYLEPHRINE 40 MCG/ML (10ML) SYRINGE FOR IV PUSH (FOR BLOOD PRESSURE SUPPORT): 80.0000 ug | PREFILLED_SYRINGE | INTRAVENOUS | Status: DC | PRN

## 2019-03-02 MED ORDER — IBUPROFEN 600 MG PO TABS
600.0000 mg | ORAL_TABLET | Freq: Four times a day (QID) | ORAL | Status: DC
  Administered 2019-03-02 – 2019-03-04 (×7): 600 mg via ORAL
  Filled 2019-03-02 (×8): qty 1

## 2019-03-02 MED ORDER — DIBUCAINE (PERIANAL) 1 % EX OINT: 1.0000 "application " | TOPICAL_OINTMENT | CUTANEOUS | Status: DC | PRN

## 2019-03-02 MED ORDER — LACTATED RINGERS IV SOLN
500.0000 mL | Freq: Once | INTRAVENOUS | Status: AC
  Administered 2019-03-02: 500 mL via INTRAVENOUS

## 2019-03-02 MED ORDER — LACTATED RINGERS IV SOLN
500.0000 mL | INTRAVENOUS | Status: DC | PRN
  Administered 2019-03-02: 500 mL via INTRAVENOUS

## 2019-03-02 MED ORDER — OXYCODONE-ACETAMINOPHEN 5-325 MG PO TABS: 2.0000 | ORAL_TABLET | ORAL | Status: DC | PRN

## 2019-03-02 MED ORDER — OXYTOCIN BOLUS FROM INFUSION
500.0000 mL | Freq: Once | INTRAVENOUS | Status: AC
  Administered 2019-03-02: 500 mL via INTRAVENOUS

## 2019-03-02 MED ORDER — FENTANYL-BUPIVACAINE-NACL 0.5-0.125-0.9 MG/250ML-% EP SOLN
12.0000 mL/h | EPIDURAL | Status: DC | PRN
  Filled 2019-03-02: qty 250

## 2019-03-02 MED ORDER — SIMETHICONE 80 MG PO CHEW: 80.0000 mg | CHEWABLE_TABLET | ORAL | Status: DC | PRN

## 2019-03-02 MED ORDER — LABETALOL HCL 5 MG/ML IV SOLN: 40.0000 mg | INTRAVENOUS | Status: DC | PRN

## 2019-03-02 MED ORDER — HYDRALAZINE HCL 20 MG/ML IJ SOLN: 10.0000 mg | INTRAMUSCULAR | Status: DC | PRN

## 2019-03-02 MED ORDER — SODIUM CHLORIDE (PF) 0.9 % IJ SOLN
INTRAMUSCULAR | Status: DC | PRN
  Administered 2019-03-02: 12 mL/h via EPIDURAL

## 2019-03-02 MED ORDER — OXYCODONE-ACETAMINOPHEN 5-325 MG PO TABS: 1.0000 | ORAL_TABLET | ORAL | Status: DC | PRN

## 2019-03-02 MED ORDER — LACTATED RINGERS IV SOLN: 500.0000 mL | Freq: Once | INTRAVENOUS | Status: DC

## 2019-03-02 MED ORDER — LABETALOL HCL 5 MG/ML IV SOLN: 80.0000 mg | INTRAVENOUS | Status: DC | PRN

## 2019-03-02 MED ORDER — ONDANSETRON HCL 4 MG PO TABS: 4.0000 mg | ORAL_TABLET | ORAL | Status: DC | PRN

## 2019-03-02 MED ORDER — LABETALOL HCL 5 MG/ML IV SOLN: 20.0000 mg | INTRAVENOUS | Status: DC | PRN

## 2019-03-02 MED ORDER — SENNOSIDES-DOCUSATE SODIUM 8.6-50 MG PO TABS
2.0000 | ORAL_TABLET | ORAL | Status: DC
  Administered 2019-03-02 – 2019-03-03 (×2): 2 via ORAL
  Filled 2019-03-02 (×2): qty 2

## 2019-03-02 MED ORDER — LACTATED RINGERS IV SOLN
INTRAVENOUS | Status: DC
  Administered 2019-03-02: 04:00:00 via INTRAVENOUS

## 2019-03-02 MED ORDER — ACETAMINOPHEN 325 MG PO TABS
650.0000 mg | ORAL_TABLET | ORAL | Status: DC | PRN
  Administered 2019-03-02: 650 mg via ORAL
  Filled 2019-03-02: qty 2

## 2019-03-02 MED ORDER — SOD CITRATE-CITRIC ACID 500-334 MG/5ML PO SOLN: 30.0000 mL | ORAL | Status: DC | PRN

## 2019-03-02 MED ORDER — COCONUT OIL OIL: 1.0000 "application " | TOPICAL_OIL | Status: DC | PRN

## 2019-03-02 MED ORDER — ONDANSETRON HCL 4 MG/2ML IJ SOLN: 4.0000 mg | INTRAMUSCULAR | Status: DC | PRN

## 2019-03-02 MED ORDER — DIPHENHYDRAMINE HCL 50 MG/ML IJ SOLN: 12.5000 mg | INTRAMUSCULAR | Status: DC | PRN

## 2019-03-02 MED ORDER — LIDOCAINE HCL (PF) 1 % IJ SOLN
INTRAMUSCULAR | Status: DC | PRN
  Administered 2019-03-02 (×2): 4 mL via EPIDURAL

## 2019-03-02 MED ORDER — OXYTOCIN 40 UNITS IN NORMAL SALINE INFUSION - SIMPLE MED
1.0000 m[IU]/min | INTRAVENOUS | Status: DC
  Administered 2019-03-02: 2 m[IU]/min via INTRAVENOUS

## 2019-03-02 MED ORDER — ONDANSETRON HCL 4 MG/2ML IJ SOLN: 4.0000 mg | Freq: Four times a day (QID) | INTRAMUSCULAR | Status: DC | PRN

## 2019-03-02 MED ORDER — LIDOCAINE HCL (PF) 1 % IJ SOLN: 30.0000 mL | INTRAMUSCULAR | Status: DC | PRN

## 2019-03-02 NOTE — Lactation Note (Signed)
This note was copied from a baby's chart. Lactation Consultation Note  Patient Name: Morgan Carlson [id number] Date: 03/02/2019 Reason for consult: Follow-up assessment;1st time breastfeeding;Primapara;Term;Mother's request  23 hours old FT female who is now being partially BF and formula fed by his mother, she's a P1. Mom called for assist but by the time LC came in the room she was already giving baby a bottle with Gerber Gentle. Asked mom to call for assistance when needed. Mom aware that there's only one LC for the entire hospital after 3 pm and if LC is working in a different room it's OK for mom to call her RN to help with the feeding.  Mom has already been given breast shells and a hand pump due to flat nipples; she was wearing her shells when entering the room and told LC that she's planning on using the hand pump, explained to her that the main purpose of the pump at this point is to help evert her nipples. Discussed normal newborn behavior, cluster feeding and size of baby's stomach.  Feeding plan:  1. Encouraged mom to feed baby STS 8-12 times/24 hours or sooner if feeding cues are present 2. Mom will try pumping prior feedings to help evert her nipples; hand expression was also encouraged  Parents reported all questions and concerns were answered, they're both aware of [facility] OP services and will call PRN.  Maternal Data    Feeding    Interventions Interventions: Breast feeding basics reviewed  Lactation Tools Discussed/Used     Consult Status Consult Status: Follow-up Date: 03/03/19 Follow-up type: In-patient    Morgan Carlson 03/02/2019, 4:27 PM

## 2019-03-02 NOTE — Discharge Summary (Signed)
Postpartum Discharge Summary    Patient Name: Morgan Carlson DOB: May 17, 1996 MRN: [id number]  Date of admission: 03/02/2019 Delivering Provider: Clarnce Flock   Date of discharge: 03/04/2019  Admitting diagnosis: 40 wks ctx every 3 min Intrauterine pregnancy: [redacted]w[redacted]d    Secondary diagnosis:  Active Problems:   Encounter for induction of labor  Additional problems:  Gestational hypertension     Discharge diagnosis: Term Pregnancy Delivered and Gestational Hypertension                                                                                                Post partum procedures:None  Augmentation: n/a  Complications: None  Hospital course:  Onset of Labor With Vaginal Delivery     23y.o. yo G1P0 at 22w0das admitted in Active Labor on 03/02/2019. Patient had an uncomplicated labor course as follows: Arrived at 5cm and progressed spontaneously to 10cm.  Membrane Rupture Time/Date: 6:07 AM ,03/02/2019   Intrapartum Procedures: Episiotomy: None [1]                                         Lacerations:  2nd degree [3];Perineal [11]  Patient had a delivery of a Viable infant. 03/02/2019  Information for the patient's newborn:  WiNiva, Murren0[[id number]]Delivery Method: Vag-Spont(filed from delivery)     Pateint had an uncomplicated postpartum course.  She is ambulating, tolerating a regular diet, passing flatus, and urinating well. Patient is discharged home in stable condition on 03/04/19. Blood pressures controlled post-partum and no medications needed.   Delivery time: 6:33 AM    Magnesium Sulfate received: No BMZ received: No Rhophylac:N/A MMR:No Transfusion:No  Physical exam  Vitals:   03/03/19 0500 03/03/19 1812 03/03/19 2300 03/04/19 0500  BP: 117/64 128/76 130/75 127/80  Pulse: 90 83 100 80  Resp: _0 Temp: 98 F (36.7 C) 98.2 F (36.8 C) 99.1 F (37.3 C) 98.1 F (36.7 C)  TempSrc: Oral Oral Oral Oral  SpO2:      Weight:      Height:        General: alert, cooperative and no distress Lochia: appropriate Uterine Fundus: firm DVT Evaluation: No evidence of DVT seen on physical exam. Labs: Lab Results  Component Value Date   WBC 10.6 (H) 03/02/2019   HGB 11.5 (L) 03/02/2019   HCT 35.2 (L) 03/02/2019   MCV 82.1 03/02/2019   PLT 214 03/02/2019   CMP Latest Ref Rng & Units 03/02/2019  Glucose 70 - 99 mg/dL 91  BUN 6 - 20 mg/dL 5(L)  Creatinine 0.44 - 1.00 mg/dL 0.73  Sodium 135 - 145 mmol/L 136  Potassium 3.5 - 5.1 mmol/L 3.8  Chloride 98 - 111 mmol/L 106  CO2 22 - 32 mmol/L 21(L)  Calcium 8.9 - 10.3 mg/dL 9.4  Total Protein 6.5 - 8.1 g/dL 6.3(L)  Total Bilirubin 0.3 - 1.2 mg/dL 0.9  Alkaline Phos 38 - 126 U/L 136(H)  AST 15 - 41 U/L  21  ALT 0 - 44 U/L 13    Discharge instruction: per After Visit Summary and "Baby and Me Booklet".  After visit meds:  Allergies as of 03/04/2019      Reactions   Peanut-containing Drug Products       Medication List    STOP taking these medications   pseudoephedrine 120 MG 12 hr tablet Commonly known as: Sudafed 12 Hour   triamcinolone cream 0.5 % Commonly known as: KENALOG     TAKE these medications   acetaminophen 500 MG tablet Commonly known as: TYLENOL Take 500 mg by mouth every 6 (six) hours as needed for mild pain.   AMBULATORY NON FORMULARY MEDICATION 1 Device by Other route once a week. Blood pressure cuff/Large  Monitored Regularly at home  ICD 10: Z34.90   cetirizine 10 MG tablet Commonly known as: ZyrTEC Allergy Take 1 tablet (10 mg total) by mouth daily.   fluticasone 50 MCG/ACT nasal spray Commonly known as: Flonase Place 2 sprays into both nostrils daily. What changed: Another medication with the same name was removed. Continue taking this medication, and follow the directions you see here.   PRENATAL VITAMINS PO Take by mouth.   senna-docusate 8.6-50 MG tablet Commonly known as: Senokot-S Take 2 tablets by mouth daily. Start taking on:  March 05, 2019   Vitafol Gummies 3.33-0.333-34.8 MG Chew Chew 34.8 mg by mouth daily.   witch hazel-glycerin pad Commonly known as: TUCKS Apply 1 application topically as needed for hemorrhoids.       Diet: routine diet  Activity: Advance as tolerated. Pelvic rest for 6 weeks.   Outpatient follow up:1 week, BP check Follow up Appt: Future Appointments  Date Time Provider [place]  03/05/2019  7:40 AM [facility] 1 [facility] None  04/11/2019 10:00 AM Truett Mainland, DO [facility] None   Follow up Visit:  Please schedule this patient for Postpartum visit in: 1 week with the following provider: MD For C/S patients schedule nurse incision check in weeks 2 weeks: no High risk pregnancy complicated by: gestational HTN Delivery mode:  SVD Anticipated Birth Control:  other/unsure PP Procedures needed: BP check  Schedule Integrated BH visit: no  Newborn Data: Live born female  Birth Weight: 3415 g APGAR: 62, 9  Newborn Delivery   Birth date/time: 03/02/2019 06:33:00 Delivery type: Vaginal, Spontaneous      Baby Feeding: Breast Disposition:home with mother   03/04/2019 Chauncey Mann, MD

## 2019-03-02 NOTE — Anesthesia Postprocedure Evaluation (Signed)
Anesthesia Post Note  Patient: Morgan Carlson  Procedure(s) Performed: AN AD [place]     Patient location during evaluation: Mother Baby Anesthesia Type: Epidural Level of consciousness: awake Pain management: satisfactory to patient Vital Signs Assessment: post-procedure vital signs reviewed and stable Respiratory status: spontaneous breathing Cardiovascular status: stable Anesthetic complications: no    Last Vitals:  Vitals:   03/02/19 0849 03/02/19 0955  BP: 138/87 130/84  Pulse: 85 79  Resp: 18   Temp: 36.6 C 36.8 C  SpO2: 100%     Last Pain:  Vitals:   03/02/19 0955  TempSrc: Oral  PainSc: 0-No pain   Pain Goal:                   [employer]

## 2019-03-02 NOTE — Lactation Note (Signed)
This note was copied from a baby's chart. Lactation Consultation Note  Patient Name: Morgan Carlson Date: 03/02/2019 Reason for consult: Initial assessment;Primapara;1st time breastfeeding;Term  LC in to visit with P84 Mom of term baby at 73 hrs old.  Baby sleeping STS on FOB's chest currently.  Mom is resting in bed.  Baby has latched once and 2 attempts.   Mom states she will call out for assistance when baby starts cueing he is hungry.  Mom aware of breast massage and hand expression.    Encouraged continued STS and watching for early signs of hunger cues.  Offered to assist with positioning and latching at next feeding.  Mom given Lactation brochure and information about IP and OP lactation support.  Mom to call prn for assistance.   Maternal Data Has patient been taught Hand Expression?: Yes Does the patient have breastfeeding experience prior to this delivery?: No  Feeding Feeding Type: Breast Fed  LATCH Score Latch: Grasps breast easily, tongue down, lips flanged, rhythmical sucking.  Audible Swallowing: A few with stimulation  Type of Nipple: Flat  Comfort (Breast/Nipple): Soft / non-tender  Hold (Positioning): Assistance needed to correctly position infant at breast and maintain latch.  LATCH Score: 7  Interventions Interventions: Breast feeding basics reviewed;Skin to skin;Breast massage;Hand express   Consult Status Consult Status: Follow-up Date: 03/03/19 Follow-up type: In-patient    Morgan Carlson 03/02/2019, 12:18 PM

## 2019-03-02 NOTE — Anesthesia Preprocedure Evaluation (Signed)
Anesthesia Evaluation  Patient identified by MRN, date of birth, ID band Patient awake    Reviewed: Allergy & Precautions, Patient's Chart, lab work & pertinent test results  History of Anesthesia Complications Negative for: history of anesthetic complications  Airway Mallampati: II  TM Distance: >3 FB Neck ROM: Full    Dental no notable dental hx.    Pulmonary neg pulmonary ROS,    Pulmonary exam normal        Cardiovascular negative cardio ROS Normal cardiovascular exam     Neuro/Psych negative neurological ROS  negative psych ROS   GI/Hepatic negative GI ROS, Neg liver ROS,   Endo/Other  Morbid obesity  Renal/GU negative Renal ROS  negative genitourinary   Musculoskeletal negative musculoskeletal ROS (+)   Abdominal   Peds negative pediatric ROS (+)  Hematology negative hematology ROS (+)   Anesthesia Other Findings   Reproductive/Obstetrics (+) Pregnancy                             Anesthesia Physical Anesthesia Plan  ASA: III  Anesthesia Plan: Epidural   Post-op Pain Management:    Induction:   PONV Risk Score and Plan: Treatment may vary due to age or medical condition  Airway Management Planned: Natural Airway  Additional Equipment:   Intra-op Plan:   Post-operative Plan:   Informed Consent: I have reviewed the patients History and Physical, chart, labs and discussed the procedure including the risks, benefits and alternatives for the proposed anesthesia with the patient or authorized representative who has indicated his/her understanding and acceptance.       Plan Discussed with:   Anesthesia Plan Comments:         Anesthesia Quick Evaluation  

## 2019-03-02 NOTE — Anesthesia Procedure Notes (Signed)
Epidural Patient location during procedure: OB Start time: 03/02/2019 3:38 AM End time: 03/02/2019 3:40 AM  Staffing Anesthesiologist: Brennan Bailey, MD Performed: anesthesiologist   Preanesthetic Checklist Completed: patient identified, pre-op evaluation, timeout performed, IV checked, risks and benefits discussed and monitors and equipment checked  Epidural Patient position: sitting Prep: site prepped and draped and DuraPrep Patient monitoring: continuous pulse ox, blood pressure and heart rate Approach: midline Location: L3-L4 Injection technique: LOR air  Needle:  Needle type: Tuohy  Needle gauge: 17 G Needle length: 9 cm Catheter type: closed end flexible Catheter size: 19 Gauge Catheter at skin depth: 13 cm Test dose: negative and Other (1% lidocaine)  Assessment Events: blood not aspirated, injection not painful, no injection resistance, negative IV test and no paresthesia  Additional Notes Patient identified. Risks, benefits, and alternatives discussed with patient including but not limited to bleeding, infection, nerve damage, paralysis, failed block, incomplete pain control, headache, blood pressure changes, nausea, vomiting, reactions to medication, itching, and postpartum back pain. Confirmed with bedside nurse the patient's most recent platelet count. Confirmed with patient that they are not currently taking any anticoagulation, have any bleeding history, or any family history of bleeding disorders. Patient expressed understanding and wished to proceed. All questions were answered. Sterile technique was used throughout the entire procedure. Please see nursing notes for vital signs.   Crisp LOR on first pass. Test dose was given through epidural catheter and negative prior to continuing to dose epidural or start infusion. Warning signs of high block given to the patient including shortness of breath, tingling/numbness in hands, complete motor block, or any concerning  symptoms with instructions to call for help. Patient was given instructions on fall risk and not to get out of bed. All questions and concerns addressed with instructions to call with any issues or inadequate analgesia.  Reason for block:procedure for pain

## 2019-03-02 NOTE — MAU Note (Signed)
Pt reports she has had ctx on and off since 4am yesterday morning. Now they are 3 min apart. Denies any vag bleeding or leaking . Reports good fetal movement felt.

## 2019-03-02 NOTE — H&P (Signed)
LABOR AND DELIVERY ADMISSION HISTORY AND PHYSICAL NOTE  Morgan Carlson is a 23 y.o. female G1P0 with IUP at 15w0dby LMP c/w 19wk [place]ore[place]resenting for labor and elevated blood pressures.   She endorses contractions since 4 AM yesterday.   She reports positive fetal movement. She denies leakage of fluid or vaginal bleeding.   She plans on breast feeding. She is undecided for birth control.  On arrival to [facility] BP was 156/93, subsequently had several severe range BP's highest 167/116, most recent 151/83.  [facility] History/Com[facility]  _[facility] , CWD, normal anatomy, cephalic presentation, 658%KDX EFW [id number] complications:  - Sickle cell trait - Obesity - elevated BP's on arrival to [facility]  Past Medical History: Past Medical History:  Diagnosis Date  . Medical history non-contributory     Past Surgical History: Past Surgical History:  Procedure Laterality Date  . NO PAST SURGERIES      Obstetrical History: OB History    Gravida  1   Para      Term      Preterm      AB      Living        SAB      TAB      Ectopic      Multiple      Live Births              Social History: Social History   Socioeconomic History  . Marital status: Single    Spouse name: Not on file  . Number of children: Not on file  . Years of education: Not on file  . Highest education level: Not on file  Occupational History  . Not on file  Social Needs  . Financial resource strain: Not on file  . Food insecurity    Worry: Not on file    Inability: Not on file  . Transportation needs    Medical: Not on file    Non-medical: Not on file  Tobacco Use  . Smoking status: Never Smoker  . Smokeless tobacco: Never Used  Substance and Sexual Activity  . Alcohol use: No  . Drug use: No  . Sexual activity: Not Currently    Birth control/protection: None  Lifestyle  . Physical activity    Days per week: Not on file    Minutes per session: Not on file   . Stress: Not on file  Relationships  . Social [place]: Not on file    Gets together: Not on file    Attends religious service: Not on file    Active member of club or organization: Not on file    Attends meetings of clubs or organizations: Not on file    Relationship status: Not on file  Other Topics Concern  . Not on file  Social History Narrative  . Not on file    Family History: History reviewed. No pertinent family history.  Allergies: Allergies  Allergen Reactions  . Peanut-Containing Drug Products     Medications Prior to Admission  Medication Sig Dispense Refill Last Dose  . AMBULATORY NON FORMULARY MEDICATION 1 Device by Other route once a week. Blood pressure cuff/Large  Monitored Regularly at home  ICD 10: Z34.90 1 kit 0 03/01/2019 at Unknown time  . fluticasone (FLONASE) 50 MCG/ACT nasal spray Place 2 sprays into both nostrils daily. 16 g 5 03/02/2019 at Unknown time  . fluticasone (FLONASE) 50 MCG/ACT nasal spray  Place 1 spray into both nostrils daily. 16 g 2 Past Week at Unknown time  . Prenatal Vit-Fe Fumarate-FA (PRENATAL VITAMINS PO) Take by mouth.   03/01/2019 at Unknown time  . triamcinolone cream (KENALOG) 0.5 % Apply 1 application topically 3 (three) times daily. 30 g 0 Past Month at Unknown time  . cetirizine (ZYRTEC ALLERGY) 10 MG tablet Take 1 tablet (10 mg total) by mouth daily. (Patient not taking: Reported on 02/22/2019) 30 tablet 1 More than a month at Unknown time  . Prenatal Vit-Fe Phos-FA-Omega (VITAFOL GUMMIES) 3.33-0.333-34.8 MG CHEW Chew 34.8 mg by mouth daily. 90 tablet 11 More than a month at Unknown time  . pseudoephedrine (SUDAFED 12 HOUR) 120 MG 12 hr tablet Take 1 tablet (120 mg total) by mouth 2 (two) times daily. (Patient not taking: Reported on 08/09/2018) 30 tablet 3 More than a month at Unknown time     Review of Systems  All systems reviewed and negative except as stated in HPI  Physical Exam Blood pressure (!)  151/83, pulse 61, temperature 99 F (37.2 C), resp. rate 20, height _0  (1.651 m), weight 128.4 kg, last menstrual period 05/26/2018. General appearance: alert, oriented, NAD Lungs: normal respiratory effort Heart: regular rate Abdomen: soft, non-tender; gravid, leopolds 3600g Extremities: No calf swelling or tenderness Presentation: cephalic by RN exam Fetal monitoringBaseline: 140 bpm, Variability: Good {> 6 bpm), Accelerations: Reactive and Decelerations: Absent Uterine activityFrequency: Every 3-6 minutes Dilation: 5.5 Effacement (%): 70 Station: -1 Exam by:: K.Wilson,RN  Prenatal labs: ABO, Rh: --/--/O POS (09/05 0157) Antibody: NEG (09/05 0157) Rubella: 7.74 (02/13 1640) RPR: Non Reactive (06/18 1050)  HBsAg: Negative (02/13 1640)  HIV: Non Reactive (06/18 1050)  GC/Chlamydia: neg/neg 02/13/2019 GBS:   Neg 02/13/2019 2-hr GTT: neg 12/13/2018 Genetic screening:  Low risk Panorama 08/09/2018 Anatomy US: normal 12/19/2018  Prenatal Transfer Tool  Maternal Diabetes: No Genetic Screening: Normal Maternal Ultrasounds/Referrals: Normal Fetal Ultrasounds or other Referrals:  None Maternal Substance Abuse:  No Significant Maternal Medications:  None Significant Maternal Lab Results: Group B Strep negative  Results for orders placed or performed during the hospital encounter of 03/02/19 (from the past 24 hour(s))  CBC   Collection Time: 03/02/19  1:57 AM  Result Value Ref Range   WBC 10.6 (H) 4.0 - 10.5 K/uL   RBC 4.29 3.87 - 5.11 MIL/uL   Hemoglobin 11.5 (L) 12.0 - 15.0 g/dL   HCT 35.2 (L) 36.0 - 46.0 %   MCV 82.1 80.0 - 100.0 fL   MCH 26.8 26.0 - 34.0 pg   MCHC 32.7 30.0 - 36.0 g/dL   RDW 15.9 (H) 11.5 - 15.5 %   Platelets 214 150 - 400 K/uL   nRBC 0.0 0.0 - 0.2 %  Comprehensive metabolic panel   Collection Time: 03/02/19  1:57 AM  Result Value Ref Range   Sodium 136 135 - 145 mmol/L   Potassium 3.8 3.5 - 5.1 mmol/L   Chloride 106 98 - 111 mmol/L   CO2 21 (L) 22 -  32 mmol/L   Glucose, Bld 91 70 - 99 mg/dL   BUN 5 (L) 6 - 20 mg/dL   Creatinine, Ser 0.73 0.44 - 1.00 mg/dL   Calcium 9.4 8.9 - 10.3 mg/dL   Total Protein 6.3 (L) 6.5 - 8.1 g/dL   Albumin 2.8 (L) 3.5 - 5.0 g/dL   AST 21 15 - 41 U/L   ALT 13 0 - 44 U/L   Alkaline Phosphatase 136 (H) 38 -  126 U/L   Total Bilirubin 0.9 0.3 - 1.2 mg/dL   GFR calc non Af Amer >60 >60 mL/min   GFR calc Af Amer >60 >60 mL/min   Anion gap 9 5 - 15  Type and screen   Collection Time: 03/02/19  1:57 AM  Result Value Ref Range   ABO/RH(D) O POS    Antibody Screen NEG    Sample Expiration      03/05/2019,2359 Performed at [facility], [place] [address] [place], [place] [address]     Patient Active Problem List   Diagnosis Date Noted  . Encounter for induction of labor 03/02/2019  . Obesity in pregnancy, antepartum 11/06/2018  . Supervision of normal first pregnancy, antepartum 08/09/2018  . Sickle cell trait ([place]) 08/09/2018  . Eczema 08/09/2018    Assessment: Morgan Carlson is a 23 y.o. G1P0 at 96w0dhere for active labor and newly diagnosed gHTN.   #Labor: Plan to start pitocin once receives epidural as contractions are not in regular pattern #Pain: Epidural now #FWB: Cat I, reassuring #GBS/ID:  Negative  #COVID: swab negative #MOF: Breast #MOC: Undecided #Circ:  yes #gHTN: Newly diagnosed with severe range BP's but normal CBC/CMP/UPCR. Continue to monitor closely, IV HTN meds if severe range again.   MAnnice NeedyENorthwest Florida Surgery Center9/10/2018, 3:32 AM

## 2019-03-03 NOTE — Lactation Note (Signed)
This note was copied from a baby's chart. Lactation Consultation Note  Patient Name: Morgan Carlson [id number] Date: 03/03/2019 Reason for consult: Difficult latch;Mother's request;Follow-up assessment;Term;1st time breastfeeding P1, 8 hour female infant. Mom's feeding choice at admission was breastfeeding. Mom is currently breast and formula feeding infant. Mom was given breast shells and hand pump due to having flat nipples by previous LC. Per mom, infant is not latching to breast and will only latch 2 minutes or less. so for the  last 3 feeding she has given infant formula only. Per mom, she wants to breastfeed infant but he is not latching to breast. LC entered room mom was wearing breast shells in bra she slept in them during the night. LC reinforced breast shells should only be worn in bra during the day not to wear them at night. Mom attempted to latch infant at breast multiple times infant became fussy and would not sustain latch.  Mom was fitted with 20 mm NS, mom latched infant on right breast using the football hold position, NS was pre-filled with 0.5 ml of Gerber Gentle with iron. Infant would not sustain latch at first, Morgan Carlson reposition infant at breast  and infant sustained latch, swallows observed and infant breasfed for 15 minutes.  Mom felt infant did better with NS and this is his first time really latching to breast and sustaining latch since birth. Mom is starting to feel confident  that she can breastfed infant.  Mom knows to start using DEBP and will pump after latching infant to breast. Mom plans to pump every 3 hours for 15 minutes on initial setting. Mom shown how to use DEBP & how to disassemble, clean, & reassemble parts. Mom's plans: 1. Mom will breastfeeding according hunger cues using 20 mm NS 2. Mom will wear breast shells during the day only and not at night. 3. Mom plans to supplement with formula after breastfeeding according to infant's age. 4. Mom will use  DEBP every 3 hours for 15 minutes on initial setting.   Maternal Data Formula Feeding for Exclusion: No Has patient been taught Hand Expression?: Yes(Infant given 2 ml of colostrum by spoon.)  Feeding Feeding Type: Breast Fed Nipple Type: Slow - flow  LATCH Score Latch: Repeated attempts needed to sustain latch, nipple held in mouth throughout feeding, stimulation needed to elicit sucking reflex.  Audible Swallowing: Spontaneous and intermittent  Type of Nipple: Everted at rest and after stimulation(short shafted)     Hold (Positioning): Assistance needed to correctly position infant at breast and maintain latch.     Interventions Interventions: Adjust position;Support pillows;DEBP;Position options;Expressed milk;Shells;Breast compression;Breast feeding basics reviewed;Assisted with latch;Skin to skin;Breast massage;Hand express  Lactation Tools Discussed/Used Tools: Shells;Pump;Nipple Shields Nipple shield size: 20 Breast pump type: Double-Electric Breast Pump WIC Program: Yes Pump Review: Setup, frequency, and cleaning;Milk Storage Initiated by:: Vicente Serene, IBCLC Date initiated:: 03/03/19   Consult Status Consult Status: Follow-up Date: 03/03/19 Follow-up type: In-patient    Vicente Serene 03/03/2019, 5:07 AM

## 2019-03-03 NOTE — Lactation Note (Addendum)
This note was copied from a baby's chart. Lactation Consultation Note  Patient Name: Morgan Carlson [id number] Date: 03/03/2019  infant asleep on dad.  Infant appears to be in deep sleep. Infant now close to 46 hours old.  Parents report it has been close to 5 hours since infant ate.  Urged parents to feed on cue and 8 or more times day.  Offered to assist with trying to wake baby and feed.  Parents deny need for assist,  Mom reports they are going to wait a little longer to try.  Parents report that infant had three feds in a row pretty close together earlier and is now sleepy.Mom reports she may call later for assistance. Urged to call lactation as needed.   Maternal Data    Feeding Feeding Type: Breast Fed Nipple Type: Slow - flow  LATCH Score Latch: Repeated attempts needed to sustain latch, nipple held in mouth throughout feeding, stimulation needed to elicit sucking reflex.  Audible Swallowing: Spontaneous and intermittent  Type of Nipple: Everted at rest and after stimulation  Comfort (Breast/Nipple): Soft / non-tender  Hold (Positioning): Assistance needed to correctly position infant at breast and maintain latch.  LATCH Score: 8  Interventions Interventions: Breast feeding basics reviewed;Assisted with latch;Adjust position;Support pillows;Position options  Lactation Tools Discussed/Used     Consult Status      Rollen Sox 03/03/2019, 9:21 PM

## 2019-03-04 MED ORDER — SENNOSIDES-DOCUSATE SODIUM 8.6-50 MG PO TABS: 2.0000 | ORAL_TABLET | ORAL | 0 refills | Status: DC

## 2019-03-04 MED ORDER — WITCH HAZEL-GLYCERIN EX PADS: 1.0000 "application " | MEDICATED_PAD | CUTANEOUS | 12 refills | Status: DC | PRN

## 2019-03-04 NOTE — Lactation Note (Signed)
This note was copied from a baby's chart. Lactation Consultation Note  Patient Name: Morgan Carlson [id number] Date: 03/04/2019 Reason for consult: Follow-up assessment Baby is 9 hours old and mostly formula feeding.  Mom feels like pumping and bottle feeding will be best because her nipples are short and baby doesn't stay on.  Mom has a manual pump but will obtain a DEBP from her insurance company.  Instructed to pump every 3 hours.  Encouraged mom to continue attempting latch and call for assist prn.  Discussed milk coming to volume and the prevention and treatment of engorgement.  Reviewed outpatient services and encouraged to call prn.  Maternal Data    Feeding Feeding Type: Bottle Fed - Formula Nipple Type: Slow - flow  LATCH Score                   Interventions    Lactation Tools Discussed/Used     Consult Status Consult Status: Complete Follow-up type: Call as needed    Ave Filter 03/04/2019, 10:27 AM

## 2019-03-05 ENCOUNTER — Other Ambulatory Visit (HOSPITAL_COMMUNITY)
Admission: RE | Admit: 2019-03-05 | Discharge: 2019-03-05 | Disposition: A | Discharge status: Home / Self Care | Payer: Medicaid Other | Source: Ambulatory Visit

## 2019-03-07 ENCOUNTER — Inpatient Hospital Stay (HOSPITAL_COMMUNITY)
Admission: AD | Admit: 2019-03-07 | Payer: Medicaid Other | Source: Home / Self Care | Admitting: Obstetrics & Gynecology

## 2019-03-07 ENCOUNTER — Inpatient Hospital Stay (HOSPITAL_COMMUNITY): Payer: Medicaid Other

## 2019-04-11 ENCOUNTER — Ambulatory Visit (INDEPENDENT_AMBULATORY_CARE_PROVIDER_SITE_OTHER): Payer: Medicaid Other | Admitting: Family Medicine

## 2019-04-11 ENCOUNTER — Other Ambulatory Visit: Payer: Self-pay

## 2019-04-11 DIAGNOSIS — Z1389 Encounter for screening for other disorder: Secondary | ICD-10-CM

## 2019-04-11 MED ORDER — NORETHINDRONE 0.35 MG PO TABS: 1.0000 | ORAL_TABLET | Freq: Every day | ORAL | 0 refills | Status: AC

## 2019-04-11 MED ORDER — NORETHIN ACE-ETH ESTRAD-FE 1-20 MG-MCG(24) PO TABS: 1.0000 | ORAL_TABLET | Freq: Every day | ORAL | 3 refills | Status: AC

## 2019-04-11 NOTE — Progress Notes (Signed)
Post Partum Exam  Morgan Carlson is a 23 y.o. G79P1001 female who presents for a postpartum visit. She is 5 weeks postpartum following a spontaneous vaginal delivery. I have fully reviewed the prenatal and intrapartum course. The delivery was at 40 gestational weeks.  Anesthesia: epidural. Postpartum course has been doing well. Baby's course has been doing well. Baby is feeding by bottle Dory Horn. Bleeding no bleeding. Bowel function is normal. Bladder function is normal. Patient is sexually active. Last intercourse 2 days ago. Contraception method is none. Interested in [place]. Postpartum depression screening:neg, score 0.  The following portions of the patient's history were reviewed and updated as appropriate: allergies, current medications, past family history, past medical history, past social history, past surgical history and problem list. Last pap smear done 08/09/2018 and was Normal  Review of Systems Pertinent items are noted in HPI.    Objective:  unknown if currently breastfeeding.  General:  alert, cooperative and no distress  Lungs: clear to auscultation bilaterally  Heart:  regular rate and rhythm, S1, S2 normal, no murmur, click, rub or gallop  Abdomen: soft, non-tender; bowel sounds normal; no masses,  no organomegaly        Assessment:    Normal postpartum exam. Pap smear not done at today's visit.   Plan:   1. Contraception: POP for first month, then COCs after that. 2.  Follow up in: 1 year or as needed.

## 2019-07-31 IMAGING — US US MFM OB DETAIL +14 WK
1 series · 13 of 28 positions shown · non-contrast
Comparison: none

[Series 1: us mfm ob detail +14 wk · 13 of 134 slices shown]
[im 5/134]
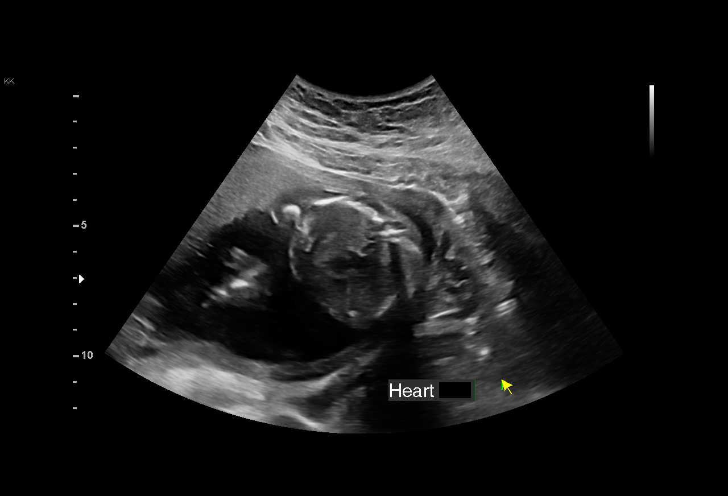
[im 15/134]
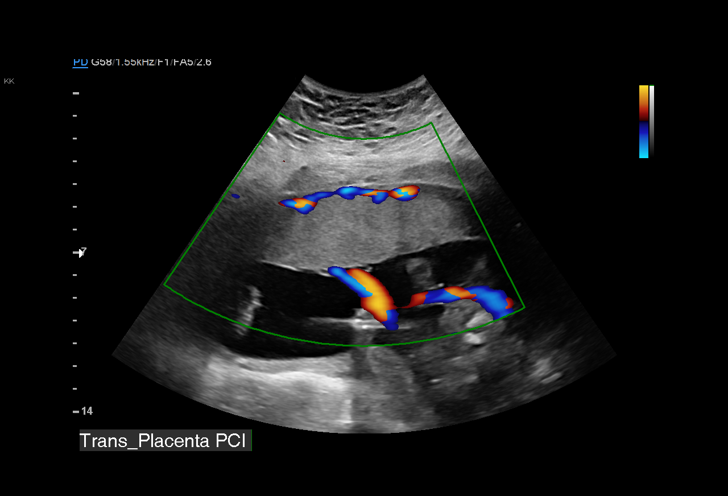
[im 25/134]
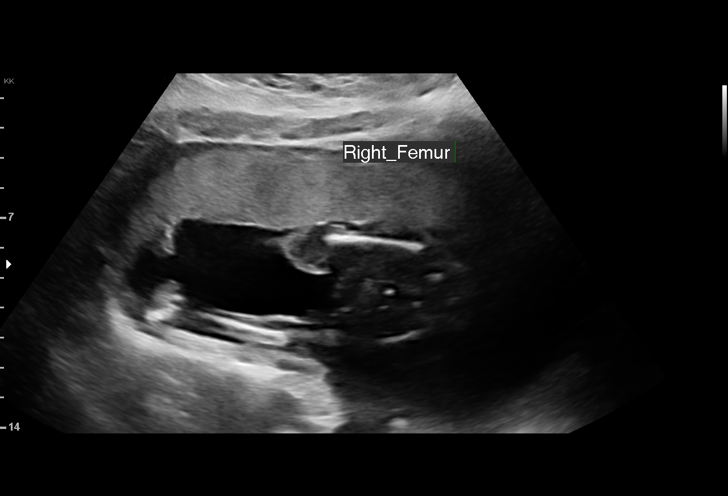
[im 35/134]
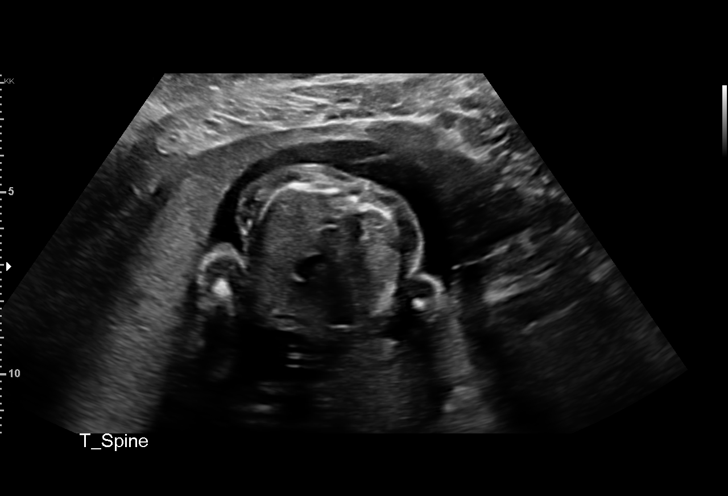
[im 45/134]
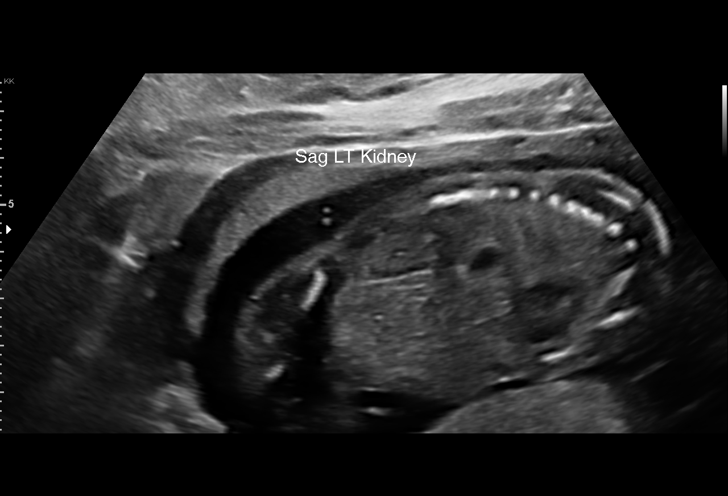
[im 55/134]
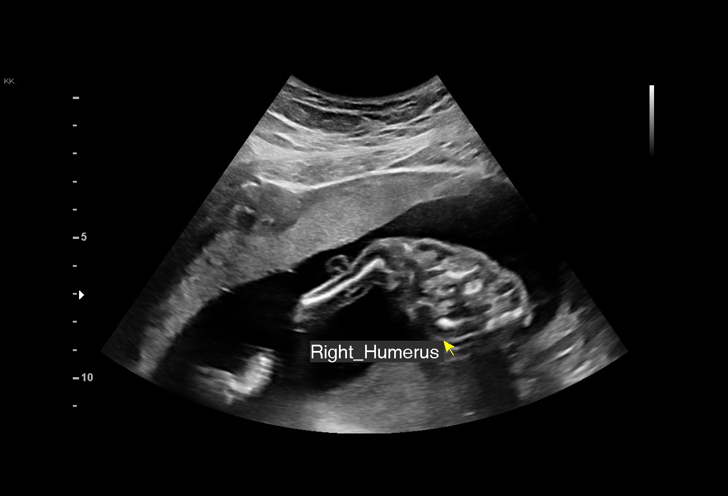
[im 69/134]
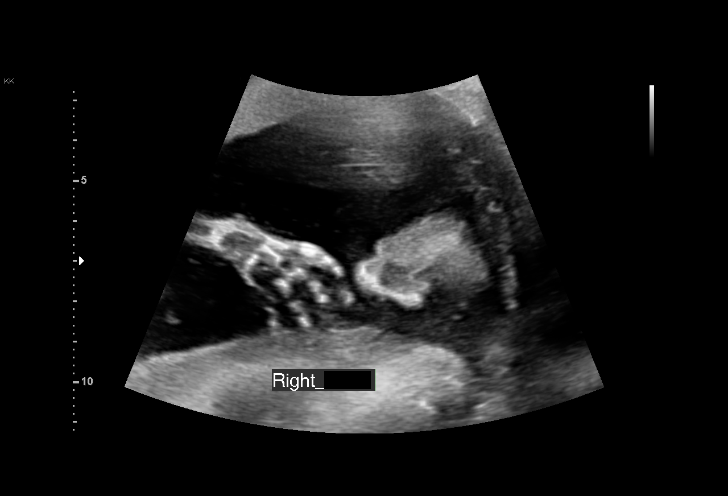
[im 79/134]
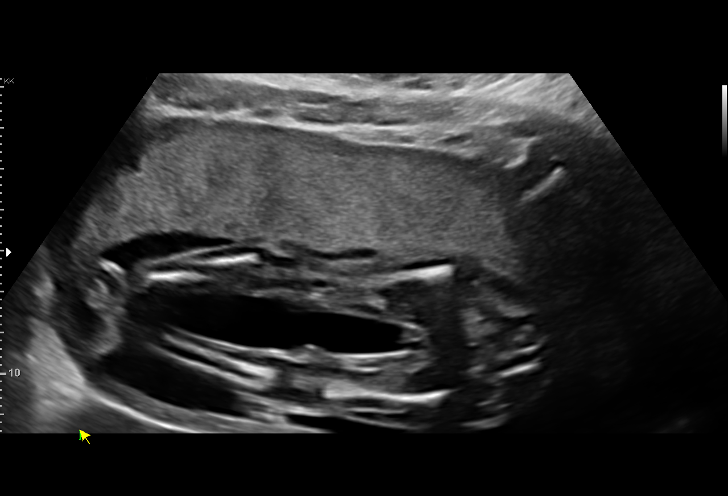
[im 89/134]
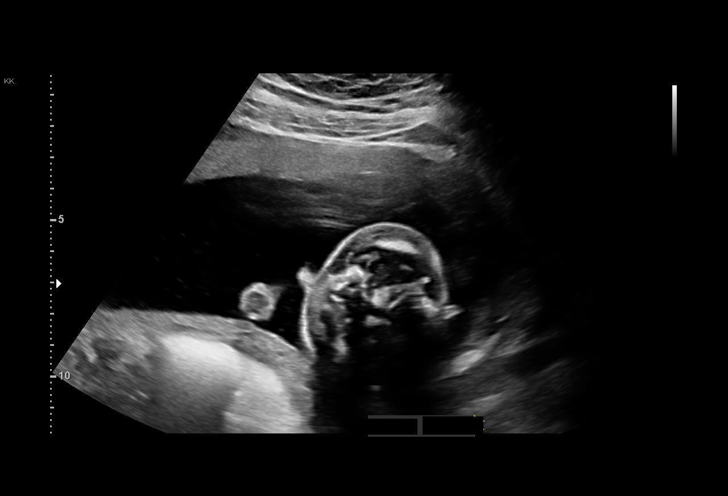
[im 99/134]
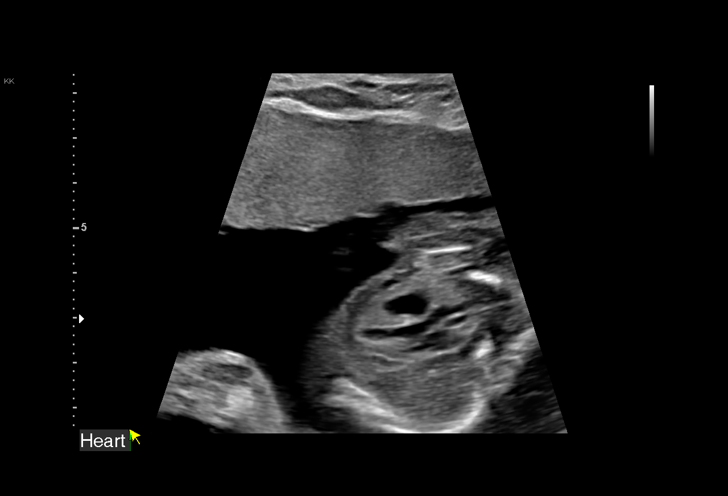
[im 109/134]
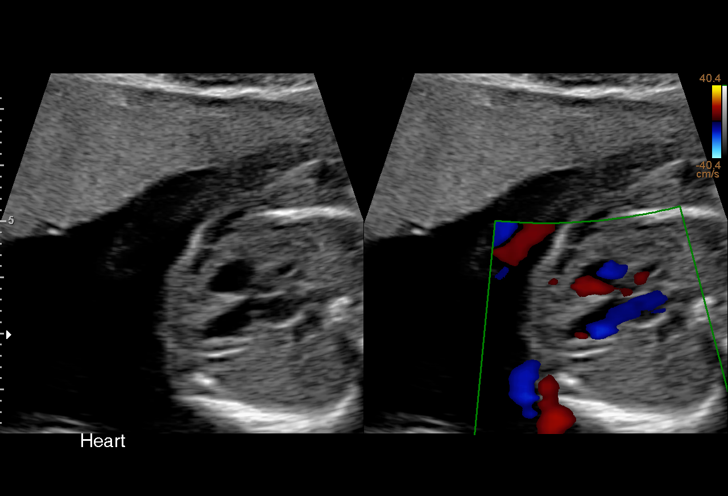
[im 119/134]
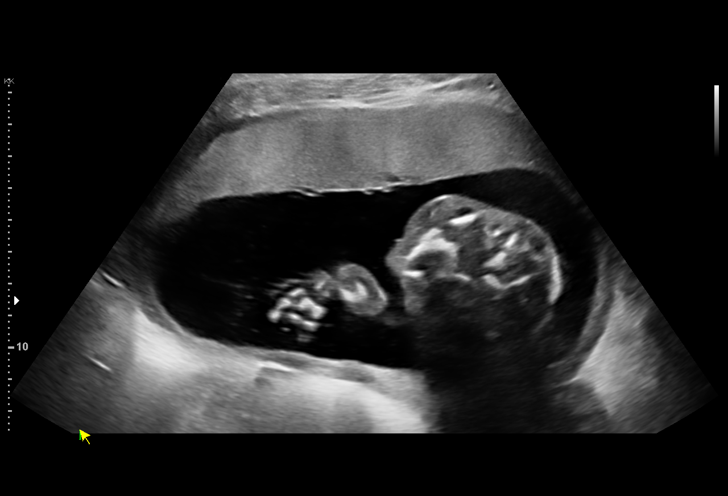
[im 129/134]
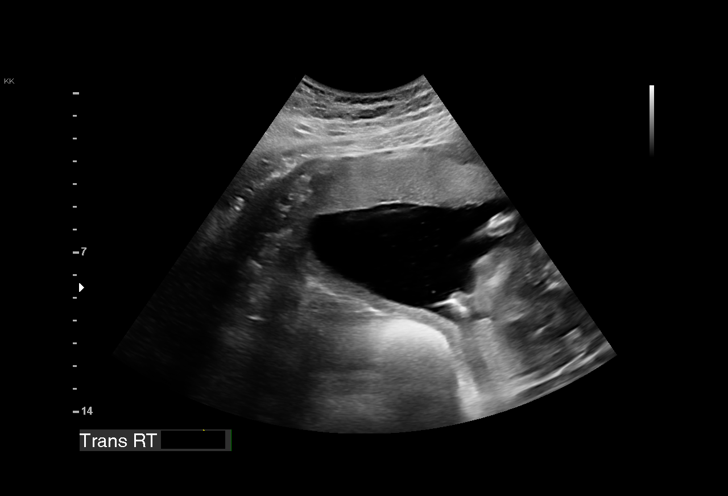

[13 of 28 positions shown; findings below may reference images not displayed]

[REDACTED]
                   KAKI CNM

 ----------------------------------------------------------------------

 ----------------------------------------------------------------------
Indications

  Encounter for antenatal screening for
  malformations
  19 weeks gestation of pregnancy
  History of sickle cell trait
  Obesity complicating pregnancy, second
  trimester (BMI 36)( NIPS Low Risk)
  Uterine fibroids
 ----------------------------------------------------------------------
Vital Signs

 BMI:
Fetal Evaluation

 Num Of Fetuses:          1
 Fetal Heart Rate(bpm):   161
 Cardiac Activity:        Observed
 Presentation:            Cephalic
 Placenta:                Anterior
 P. Cord Insertion:       Visualized

 Amniotic Fluid
 AFI FV:      Within normal limits

                             Largest Pocket(cm)

Biometry
 BPD:        47  mm     G. Age:  20w 1d         76  %    CI:        75.37   %    70 - 86
                                                         FL/HC:       19.8  %    16.8 -
 HC:      171.7  mm     G. Age:  19w 5d         51  %    HC/AC:       1.10       1.09 -
 AC:      156.2  mm     G. Age:  20w 6d         81  %    FL/BPD:      72.3  %
 FL:         34  mm     G. Age:  20w 5d         80  %    FL/AC:       21.8  %    20 - 24
 HUM:      36.5  mm     G. Age:  22w 5d       > 95  %
 NFT:       2.9  mm

 Est. FW:     366   gm   0 lb 13 oz      62  %
OB History

 Gravidity:    1
Gestational Age

 LMP:           19w 4d        Date:  05/26/18                 EDD:   03/02/19
 U/S Today:     20w 3d                                        EDD:   02/24/19
 Best:          19w 4d     Det. By:  LMP  (05/26/18)          EDD:   03/02/19
Anatomy

 Cranium:               Appears normal         Aortic Arch:            Appears normal
 Cavum:                 Appears normal         Ductal Arch:            Appears normal
 Ventricles:            Appears normal         Diaphragm:              Appears normal
 Choroid Plexus:        Appears normal         Stomach:                Appears normal, left
                                                                       sided
 Cerebellum:            Appears normal         Abdomen:                Appears normal
 Posterior Fossa:       Appears normal         Abdominal Wall:         Not well visualized
 Nuchal Fold:           Appears normal         Cord Vessels:           Appears normal (3
                                                                       vessel cord)
 Face:                  Appears normal         Kidneys:                Appear normal
                        (orbits and profile)
 Lips:                  Appears normal         Bladder:                Appears normal
 Thoracic:              Appears normal         Spine:                  Appears normal
 Heart:                 Not well visualized    Upper Extremities:      Appears normal
 RVOT:                  Not well visualized    Lower Extremities:      Appears normal
 LVOT:                  Not well visualized

 Other:  Heels and 5th digit visualized. Technically difficult due to fetal position.
Cervix Uterus Adnexa

 Cervix
 Length:            3.5  cm.
 Normal appearance by transabdominal scan.
Myomas

  Site                     L(cm)      W(cm)      D(cm)       Location
  Anterior
 ----------------------------------------------------------------------

  Blood Flow                 RI        PI       Comments

 ----------------------------------------------------------------------
Impression

 Normal interval growth.  No ultrasonic evidence of structural
 fetal anomalies.
 Suboptimal views of the fetal anatomy were obtained
 secondary to fetal position.
Recommendations

 Follow up growth in 6 weeks.

## 2019-08-05 ENCOUNTER — Encounter (HOSPITAL_BASED_OUTPATIENT_CLINIC_OR_DEPARTMENT_OTHER): Payer: Self-pay | Admitting: *Deleted

## 2019-08-05 ENCOUNTER — Other Ambulatory Visit: Payer: Self-pay

## 2019-08-05 ENCOUNTER — Emergency Department (HOSPITAL_BASED_OUTPATIENT_CLINIC_OR_DEPARTMENT_OTHER)
Admission: EM | Admit: 2019-08-05 | Discharge: 2019-08-05 | Disposition: A | Discharge status: Home / Self Care | Payer: Medicaid Other | Attending: Emergency Medicine | Admitting: Emergency Medicine

## 2019-08-05 DIAGNOSIS — S3992XA Unspecified injury of lower back, initial encounter: Secondary | ICD-10-CM | POA: Diagnosis present

## 2019-08-05 DIAGNOSIS — S39012A Strain of muscle, fascia and tendon of lower back, initial encounter: Secondary | ICD-10-CM | POA: Insufficient documentation

## 2019-08-05 DIAGNOSIS — Y9389 Activity, other specified: Secondary | ICD-10-CM | POA: Insufficient documentation

## 2019-08-05 DIAGNOSIS — Y92410 Unspecified street and highway as the place of occurrence of the external cause: Secondary | ICD-10-CM | POA: Diagnosis not present

## 2019-08-05 DIAGNOSIS — Y999 Unspecified external cause status: Secondary | ICD-10-CM | POA: Diagnosis not present

## 2019-08-05 DIAGNOSIS — T148XXA Other injury of unspecified body region, initial encounter: Secondary | ICD-10-CM

## 2019-08-05 DIAGNOSIS — Z91010 Allergy to peanuts: Secondary | ICD-10-CM | POA: Diagnosis not present

## 2019-08-05 DIAGNOSIS — Z793 Long term (current) use of hormonal contraceptives: Secondary | ICD-10-CM | POA: Insufficient documentation
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
## 2019-08-17 ENCOUNTER — Emergency Department (HOSPITAL_BASED_OUTPATIENT_CLINIC_OR_DEPARTMENT_OTHER): Payer: Medicaid Other

## 2019-08-17 ENCOUNTER — Encounter (HOSPITAL_BASED_OUTPATIENT_CLINIC_OR_DEPARTMENT_OTHER): Payer: Self-pay | Admitting: Emergency Medicine

## 2019-08-17 ENCOUNTER — Other Ambulatory Visit: Payer: Self-pay

## 2019-08-17 ENCOUNTER — Emergency Department (HOSPITAL_BASED_OUTPATIENT_CLINIC_OR_DEPARTMENT_OTHER)
Admission: EM | Admit: 2019-08-17 | Discharge: 2019-08-17 | Disposition: A | Discharge status: Home / Self Care | Payer: Medicaid Other | Attending: Emergency Medicine | Admitting: Emergency Medicine

## 2019-08-17 DIAGNOSIS — Z91010 Allergy to peanuts: Secondary | ICD-10-CM | POA: Insufficient documentation

## 2019-08-17 DIAGNOSIS — R0789 Other chest pain: Secondary | ICD-10-CM | POA: Insufficient documentation

## 2019-08-17 DIAGNOSIS — Z79899 Other long term (current) drug therapy: Secondary | ICD-10-CM | POA: Diagnosis not present

## 2019-08-17 LAB — PREGNANCY, URINE: Preg Test, Ur: NEGATIVE

## 2019-08-17 MED ORDER — LIDOCAINE 5 % EX PTCH: 1.0000 | MEDICATED_PATCH | CUTANEOUS | 0 refills | Status: AC

## 2019-08-17 MED ORDER — NAPROXEN 500 MG PO TABS: 500.0000 mg | ORAL_TABLET | Freq: Two times a day (BID) | ORAL | 0 refills | Status: DC

## 2019-08-17 MED ORDER — METHOCARBAMOL 500 MG PO TABS: 500.0000 mg | ORAL_TABLET | Freq: Two times a day (BID) | ORAL | 0 refills | Status: DC

## 2019-08-17 MED ORDER — DICLOFENAC SODIUM 1 % EX GEL: 4.0000 g | Freq: Four times a day (QID) | CUTANEOUS | 2 refills | Status: AC

## 2019-08-17 NOTE — Discharge Instructions (Addendum)
Take it easy, but do not lay around too much as this may make any stiffness worse.  Antiinflammatory medications: Take 600 mg of ibuprofen every 6 hours or 440 mg (over the counter dose) to 500 mg (prescription dose) of naproxen every 12 hours for the next 3 days. After this time, these medications may be used as needed for pain. Take these medications with food to avoid upset stomach. Choose only one of these medications, do not take them together. Acetaminophen (generic for Tylenol): Should you continue to have additional pain while taking the ibuprofen or naproxen, you may add in acetaminophen as needed. Your daily total maximum amount of acetaminophen from all sources should be limited to 4000mg /day for persons without liver problems, or 2000mg /day for those with liver problems. Methocarbamol: Methocarbamol (generic for Robaxin) is a muscle relaxer and can help relieve stiff muscles or muscle spasms.  Do not drive or perform other dangerous activities while taking this medication as it can cause drowsiness as well as changes in reaction time and judgement. Lidocaine patches: These are available via either prescription or over-the-counter. The over-the-counter option may be more economical one and are likely just as effective. There are multiple over-the-counter brands, such as Salonpas. Diclofenac gel: This is a topical anti-inflammatory medication and can be applied directly to the painful region.  Do not use on the face or genitals.  This medication may be used as an alternative to oral anti-inflammatory medications, such as ibuprofen or naproxen. Ice: May apply ice to the area over the next 24 hours for 15 minutes at a time to reduce pain, inflammation, and swelling, if present. Exercises: Be sure to perform the attached exercises starting with three times a week and working up to performing them daily. This is an essential part of preventing long term problems.  Although the exercises are labeled as  back and shoulder exercises, they should help your area of pain as well. Follow up: Follow up with a primary care provider or orthopedic specialist for any future management of these complaints. Be sure to follow up within 7-10 days should symptoms fail to resolve. Return: Return to the ED should symptoms worsen.  For prescription assistance, may try using prescription discount sites or apps, such as goodrx.com

## 2019-08-17 NOTE — ED Notes (Signed)
Pt verbalized understanding of dc instructions.
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
## 2020-06-14 ENCOUNTER — Emergency Department (HOSPITAL_BASED_OUTPATIENT_CLINIC_OR_DEPARTMENT_OTHER)
Admission: EM | Admit: 2020-06-14 | Discharge: 2020-06-15 | Disposition: A | Discharge status: Home / Self Care | Payer: Medicaid Other | Attending: Emergency Medicine | Admitting: Emergency Medicine

## 2020-06-14 ENCOUNTER — Encounter (HOSPITAL_BASED_OUTPATIENT_CLINIC_OR_DEPARTMENT_OTHER): Payer: Self-pay | Admitting: *Deleted

## 2020-06-14 ENCOUNTER — Other Ambulatory Visit: Payer: Self-pay

## 2020-06-14 DIAGNOSIS — U071 COVID-19: Secondary | ICD-10-CM | POA: Diagnosis not present

## 2020-06-14 DIAGNOSIS — N760 Acute vaginitis: Secondary | ICD-10-CM | POA: Diagnosis not present

## 2020-06-14 DIAGNOSIS — Z91010 Allergy to peanuts: Secondary | ICD-10-CM | POA: Diagnosis not present

## 2020-06-14 DIAGNOSIS — B9689 Other specified bacterial agents as the cause of diseases classified elsewhere: Secondary | ICD-10-CM | POA: Diagnosis not present

## 2020-06-14 DIAGNOSIS — R519 Headache, unspecified: Secondary | ICD-10-CM | POA: Diagnosis present

## 2020-06-14 LAB — URINALYSIS, ROUTINE W REFLEX MICROSCOPIC
Bilirubin Urine: NEGATIVE
Glucose, UA: NEGATIVE mg/dL
Hgb urine dipstick: NEGATIVE
Ketones, ur: NEGATIVE mg/dL
Nitrite: NEGATIVE
Protein, ur: NEGATIVE mg/dL
Specific Gravity, Urine: <1.005 — ABNORMAL LOW (ref 1.005–1.030)
pH: 6 (ref 5.0–8.0)

## 2020-06-14 LAB — RESP PANEL BY RT-PCR (FLU A&B, COVID) ARPGX2
Influenza A by PCR: NEGATIVE
Influenza B by PCR: NEGATIVE
SARS Coronavirus 2 by RT PCR: POSITIVE — AB

## 2020-06-14 LAB — URINALYSIS, MICROSCOPIC (REFLEX)

## 2020-06-14 LAB — WET PREP, GENITAL
Sperm: NONE SEEN
Trich, Wet Prep: NONE SEEN
Yeast Wet Prep HPF POC: NONE SEEN

## 2020-06-14 LAB — PREGNANCY, URINE: Preg Test, Ur: NEGATIVE

## 2020-06-14 MED ORDER — DIPHENHYDRAMINE HCL 50 MG/ML IJ SOLN
12.5000 mg | Freq: Once | INTRAMUSCULAR | Status: AC
  Administered 2020-06-14: 12.5 mg via INTRAVENOUS
  Filled 2020-06-14: qty 1

## 2020-06-14 MED ORDER — SODIUM CHLORIDE 0.9 % IV BOLUS
1000.0000 mL | Freq: Once | INTRAVENOUS | Status: AC
  Administered 2020-06-14: 1000 mL via INTRAVENOUS

## 2020-06-14 MED ORDER — ACETAMINOPHEN 325 MG PO TABS
650.0000 mg | ORAL_TABLET | Freq: Once | ORAL | Status: AC | PRN
  Administered 2020-06-14: 650 mg via ORAL
  Filled 2020-06-14: qty 2

## 2020-06-14 MED ORDER — PROCHLORPERAZINE EDISYLATE 10 MG/2ML IJ SOLN
10.0000 mg | Freq: Once | INTRAMUSCULAR | Status: AC
  Administered 2020-06-14: 10 mg via INTRAVENOUS
  Filled 2020-06-14: qty 2

## 2020-06-14 NOTE — ED Notes (Signed)
Dr. Fredderick Phenix aware of positive covid result.

## 2020-06-14 NOTE — ED Notes (Signed)
ED Provider at bedside. 

## 2020-06-14 NOTE — ED Triage Notes (Signed)
Pt reports she woke with headache this am. She took excedrin with minimal relief. Pt states her headache "goes from her neck, to lower back, to groin". Denies fever, she has not been vaccinated against covid

## 2020-06-14 NOTE — ED Triage Notes (Signed)
Pt voices concern for possible STD exposure 2 weeks ago

## 2020-06-14 NOTE — ED Provider Notes (Signed)
[facility] EMERGENCY DEPARTMENT Provider Note   CSN: [id number] Arrival date & time: 06/14/20  1945     History Chief Complaint  Patient presents with  . Headache    Morgan Carlson is a 24 y.o. female who presents to the emergency department with complaints of generally not feeling well starting this morning.  Patient states she has had general malaise, chills, fatigue, generalized body aches, and a headache.  Headache is gradual onset with steady progression, feels like tension after looking at a computer screen too long, has had somewhat similar but never this uncomfortable.  Took Excedrin without significant relief.  Did have Tylenol in the emergency department which brought her pain from an 8 to a 7.  She was found to have a fever in triage, she was unaware of fever prior to arrival.  She is not received a COVID-19 vaccine.  Denies visual disturbance, numbness, weakness, dizziness like the room spinning, nausea, vomiting, chest pain, dyspnea, cough, abdominal pain, diarrhea, or dysuria.  She also mentions that she has about a week of vaginal itching, denies discharge, bleeding, dysuria, or pelvic pain. Recent unprotected intercourse no known STD exposure.   HPI     Past Medical History:  Diagnosis Date  . Medical history non-contributory     Patient Active Problem List   Diagnosis Date Noted  . Encounter for induction of labor 03/02/2019  . Obesity in pregnancy, antepartum 11/06/2018  . Supervision of normal first pregnancy, antepartum 08/09/2018  . Sickle cell trait (HCC) 08/09/2018  . Eczema 08/09/2018    Past Surgical History:  Procedure Laterality Date  . NO PAST SURGERIES       OB History    Gravida  1   Para  1   Term  1   Preterm      AB      Living  1     SAB      IAB      Ectopic      Multiple  0   Live Births  1           History reviewed. No pertinent family history.  Social History   Tobacco Use  . Smoking status:  Never Smoker  . Smokeless tobacco: Never Used  Vaping Use  . Vaping Use: Every day  Substance Use Topics  . Alcohol use: Yes  . Drug use: No    Home Medications Prior to Admission medications   Medication Sig Start Date End Date Taking? Authorizing Provider  acetaminophen (TYLENOL) 500 MG tablet Take 500 mg by mouth every 6 (six) hours as needed for mild pain.    [provider]  AMBULATORY NON FORMULARY MEDICATION 1 Device by Other route once a week. Blood pressure cuff/Large  Monitored Regularly at home  ICD 10: Z34.90 Patient not taking: Reported on 04/11/2019 01/11/19   Willodean Rosenthal, MD  cetirizine (ZYRTEC ALLERGY) 10 MG tablet Take 1 tablet (10 mg total) by mouth daily. Patient not taking: Reported on 02/22/2019 01/24/18   Wallis Bamberg, PA-C  diclofenac Sodium (VOLTAREN) 1 % GEL Apply 4 g topically 4 (four) times daily. 08/17/19   Joy, Shawn C, PA-C  fluticasone (FLONASE) 50 MCG/ACT nasal spray Place 2 sprays into both nostrils daily. Patient not taking: Reported on 03/02/2019 01/24/18   Wallis Bamberg, PA-C  lidocaine (LIDODERM) 5 % Place 1 patch onto the skin daily. Remove & Discard patch within 12 hours or as directed by MD 08/17/19   Harolyn Rutherford  C, PA-C  methocarbamol (ROBAXIN) 500 MG tablet Take 1 tablet (500 mg total) by mouth 2 (two) times daily. 08/17/19   Joy, Shawn C, PA-C  naproxen (NAPROSYN) 500 MG tablet Take 1 tablet (500 mg total) by mouth 2 (two) times daily. 08/17/19   Joy, Shawn C, PA-C  norethindrone (ORTHO MICRONOR) 0.35 MG tablet Take 1 tablet (0.35 mg total) by mouth daily for 28 days. 04/11/19 05/09/19  Levie Heritage, DO  Norethindrone Acetate-Ethinyl Estrad-FE (LOESTRIN 24 FE) 1-20 MG-MCG(24) tablet Take 1 tablet by mouth daily. Start in 1 month 04/11/19   Levie Heritage, DO  Prenatal Vit-Fe Fumarate-FA (PRENATAL VITAMINS PO) Take by mouth.    [provider]  Prenatal Vit-Fe Phos-FA-Omega (VITAFOL GUMMIES) 3.33-0.333-34.8 MG CHEW Chew 34.8  mg by mouth daily. Patient not taking: Reported on 03/02/2019 10/17/18   Willodean Rosenthal, MD    Allergies    Peanut-containing drug products  Review of Systems   Review of Systems  Constitutional: Positive for chills and fatigue. Negative for fever.  HENT: Negative for congestion, ear pain and sore throat.   Respiratory: Negative for cough and shortness of breath.   Cardiovascular: Negative for chest pain.  Gastrointestinal: Negative for abdominal pain, blood in stool, constipation, diarrhea, nausea and vomiting.  Genitourinary: Negative for dysuria, pelvic pain, vaginal bleeding and vaginal discharge.       Positive for vaginal itching.   Neurological: Positive for headaches. Negative for dizziness, seizures, syncope, speech difficulty, weakness and numbness.  All other systems reviewed and are negative.   Physical Exam Updated Vital Signs BP 123/74 (BP Location: Left Arm)   Pulse 94   Temp 99.5 F (37.5 C) (Oral)   Resp 18   Ht 5\' 7"  (1.702 m)   Wt 121.6 kg   LMP 06/07/2020   SpO2 100%   Breastfeeding No   BMI 41.97 kg/m   Physical Exam Vitals and nursing note reviewed.  Constitutional:      General: She is not in acute distress.    Appearance: She is well-developed. She is not toxic-appearing.  HENT:     Head: Normocephalic and atraumatic.     Right Ear: Ear canal normal. Tympanic membrane is not perforated, erythematous, retracted or bulging.     Left Ear: Ear canal normal. Tympanic membrane is not perforated, erythematous, retracted or bulging.     Ears:     Comments: No mastoid erythema/swelling/tenderness.     Nose:     Right Sinus: No maxillary sinus tenderness or frontal sinus tenderness.     Left Sinus: No maxillary sinus tenderness or frontal sinus tenderness.     Mouth/Throat:     Pharynx: Uvula midline. No oropharyngeal exudate or posterior oropharyngeal erythema.     Comments: Posterior oropharynx is symmetric appearing. Patient tolerating own  secretions without difficulty. No trismus. No drooling. No hot potato voice. No swelling beneath the tongue, submandibular compartment is soft.  Eyes:     General: Vision grossly intact. Gaze aligned appropriately.        Right eye: No discharge.        Left eye: No discharge.     Extraocular Movements: Extraocular movements intact.     Conjunctiva/sclera: Conjunctivae normal.     Pupils: Pupils are equal, round, and reactive to light.     Comments: PERRL. No proptosis.   Cardiovascular:     Rate and Rhythm: Normal rate and regular rhythm.     Heart sounds: No murmur heard.  Pulmonary:     Effort: Pulmonary effort is normal. No respiratory distress.     Breath sounds: Normal breath sounds. No wheezing, rhonchi or rales.  Abdominal:     General: There is no distension.     Palpations: Abdomen is soft.     Tenderness: There is no abdominal tenderness.  Genitourinary:    Comments: Deferred Musculoskeletal:     Cervical back: Normal range of motion and neck supple. No edema or rigidity.  Lymphadenopathy:     Cervical: No cervical adenopathy.  Skin:    General: Skin is warm and dry.     Findings: No rash.  Neurological:     Mental Status: She is alert.     Comments: Alert. Clear speech. No facial droop. CNIII-XII grossly intact. Bilateral upper and lower extremities' sensation grossly intact. 5/5 symmetric strength with grip strength and with plantar and dorsi flexion bilaterally . Normal finger to nose bilaterally. Negative pronator drift. Negative Romberg sign. Gait is steady and intact.   Psychiatric:        Behavior: Behavior normal.    ED Results / Procedures / Treatments   Labs (all labs ordered are listed, but only abnormal results are displayed) Labs Reviewed  RESP PANEL BY RT-PCR (FLU A&B, COVID) ARPGX2 - Abnormal; Notable for the following components:      Result Value   SARS Coronavirus 2 by RT PCR POSITIVE (*)    All other components within normal limits  WET  PREP, GENITAL  RPR  URINALYSIS, ROUTINE W REFLEX MICROSCOPIC  HIV ANTIBODY (ROUTINE TESTING W REFLEX)  GC/CHLAMYDIA PROBE AMP ([place]) NOT AT [facility]    EKG None  Radiology No results found.  Procedures Procedures (including critical care time)  Medications Ordered in ED Medications  acetaminophen (TYLENOL) tablet 650 mg (650 mg Oral Given 06/14/20 2008)  prochlorperazine (COMPAZINE) injection 10 mg (10 mg Intravenous Given 06/14/20 2252)  diphenhydrAMINE (BENADRYL) injection 12.5 mg (12.5 mg Intravenous Given 06/14/20 2252)  sodium chloride 0.9 % bolus 1,000 mL (1,000 mLs Intravenous New Bag/Given 06/14/20 2250)    ED Course  I have reviewed the triage vital signs and the nursing notes.  Pertinent labs & imaging results that were available during my care of the patient were reviewed by me and considered in my medical decision making (see chart for details).    Morgan Carlson was evaluated in Emergency Department on 06/15/2020 for the symptoms described in the history of present illness. He/she was evaluated in the context of the global COVID-19 pandemic, which necessitated consideration that the patient might be at risk for infection with the SARS-CoV-2 virus that causes COVID-19. Institutional protocols and algorithms that pertain to the evaluation of patients at risk for COVID-19 are in a state of rapid change based on information released by regulatory bodies including the CDC and federal and state organizations. These policies and algorithms were followed during the patient's care in the ED.  MDM Rules/Calculators/A&P                         Patient presents with complaint of generally not feeling well with chills, fatigue, body aches & headache today, she also mentions some vaginal pruritus x 2 weeks. . Patient is nontoxic appearing, resting comfortably, initial fever and tachycardia resolved status post antipyretics by triage team.   COVID-19 testing by triage is positive.   I suspect this is the underlying etiology to patient's symptoms she developed today including her  headache.  We discussed the option of monoclonal antibody infusion in the emergency department and/or referral to MAB clinic, discussed risks/benefits patient ultimately declined. She was given a migraine cocktail with improvement & feels ready to go home. In terms of her headache- gradual onset with steady progression in severity, no nuchal rigidity, no neuro deficits, no visual disturbance, no proptosis, no dizziness- non concerning for [facility], ICH, ischemic CVA, dural venous sinus thrombosis, acute glaucoma, giant cell arteritis, mass, or meningitis.  In terms of her COVID-19, she is not showing signs of respiratory distress, does not have any respiratory symptoms, is not hypoxic.   In terms of her vaginal pruritus: Pregnancy test is negative, urinalysis without infection, wet prep with BV, will treat with oral Flagyl.  STD testing pending, will need treatment accordingly if positive.  No complaints of pelvic/abdominal pain to me, nontender abdomen, doubt PID.  I discussed results, treatment plan, need for follow-up, and return precautions with the patient. Provided opportunity for questions, patient confirmed understanding and is in agreement with plan.    Final Clinical Impression(s) / ED Diagnoses Final diagnoses:  COVID-19  Bacterial vaginosis    Rx / DC Orders ED Discharge Orders         Ordered    metroNIDAZOLE (FLAGYL) 500 MG tablet  2 times daily        06/15/20 0004    naproxen (NAPROSYN) 500 MG tablet  2 times daily PRN        06/15/20 0004           Benjamin Casanas, Pleas KochSamantha R, PA-C 06/15/20 0006    Rolan BuccoBelfi, Melanie, MD 06/15/20 1035

## 2020-06-15 LAB — HIV ANTIBODY (ROUTINE TESTING W REFLEX): HIV Screen 4th Generation wRfx: NONREACTIVE

## 2020-06-15 LAB — RPR: RPR Ser Ql: NONREACTIVE

## 2020-06-15 MED ORDER — METRONIDAZOLE 500 MG PO TABS: 500.0000 mg | ORAL_TABLET | Freq: Two times a day (BID) | ORAL | 0 refills | Status: AC

## 2020-06-15 MED ORDER — NAPROXEN 500 MG PO TABS: 500.0000 mg | ORAL_TABLET | Freq: Two times a day (BID) | ORAL | 0 refills | Status: AC | PRN

## 2020-06-15 NOTE — Discharge Instructions (Addendum)
You were seen in the ER today for a headache as well as vaginal irritation.  Your COVID-19 test resulted positive. We are instructing patient's with COVID 19 or symptoms of COVID 19 to isolate themselves for 14 days. You may be able to discontinue self isolation if the following conditions are met:   Persons with COVID-19 who have symptoms and were directed to care for themselves at home may discontinue home isolation under the  following conditions: - It has been at least 7 days have passed since symptoms first appeared. - AND at least 3 days (72 hours) have passed since recovery defined as resolution of fever without the use of fever-reducing medications and improvement in respiratory symptoms (e.g., cough, shortness of breath)  Please follow the below instructions.   Your vaginal sample also showed findings of bacterial vaginosis, please see attached information regarding this diagnosis.  We are sending you home with Flagyl to take twice per day for the next 1 week to treat this.  Do not drink alcohol with Flagyl as it can have severe side effects.  We also are sending you home with naproxen to help with headache.  - Naproxen is a nonsteroidal anti-inflammatory medication that will help with pain and swelling. Be sure to take this medication as prescribed with food, 1 pill every 12 hours,  It should be taken with food, as it can cause stomach upset, and more seriously, stomach bleeding. Do not take other nonsteroidal anti-inflammatory medications with this such as Advil, Motrin, Aleve, Mobic, Goodie Powder, or Motrin.    We have prescribed you new medication(s) today. Discuss the medications prescribed today with your pharmacist as they can have adverse effects and interactions with your other medicines including over the counter and prescribed medications. Seek medical evaluation if you start to experience new or abnormal symptoms after taking one of these medicines, seek care immediately if you  start to experience difficulty breathing, feeling of your throat closing, facial swelling, or rash as these could be indications of a more serious allergic reaction   Please follow up with primary care within 3-5 days for re-evaluation- call prior to going to the office to make them aware of your symptoms as some offices are altering their method of seeing patients with COVID 19 symptoms, we have also provided our [place] covid clinic for follow up as well.  Return to the ER for new or worsening symptoms including but not limited to increased work of breathing, chest pain, passing out, sudden change in headache, worsening headache, neck stiffness, numbness, weakness, dizziness, or any other concerns.       Person Under Monitoring Name: Morgan Carlson  Location: [address] [place] [address]   Infection Prevention Recommendations for Individuals Confirmed to have, or Being Evaluated for, 2019 Novel Coronavirus (COVID-19) Infection Who Receive Care at Home  Individuals who are confirmed to have, or are being evaluated for, COVID-19 should follow the prevention steps below until a healthcare provider or local or state health department says they can return to normal activities.  Stay home except to get medical care You should restrict activities outside your home, except for getting medical care. Do not go to work, school, or public areas, and do not use public transportation or taxis.  Call ahead before visiting your doctor Before your medical appointment, call the healthcare provider and tell them that you have, or are being evaluated for, COVID-19 infection. This will help the healthcare provider's office take steps to  keep other people from getting infected. Ask your healthcare provider to call the local or state health department.  Monitor your symptoms Seek prompt medical attention if your illness is worsening (e.g., difficulty breathing). Before going to your  medical appointment, call the healthcare provider and tell them that you have, or are being evaluated for, COVID-19 infection. Ask your healthcare provider to call the local or state health department.  Wear a facemask You should wear a facemask that covers your nose and mouth when you are in the same room with other people and when you visit a healthcare provider. People who live with or visit you should also wear a facemask while they are in the same room with you.  Separate yourself from other people in your home As much as possible, you should stay in a different room from other people in your home. Also, you should use a separate bathroom, if available.  Avoid sharing household items You should not share dishes, drinking glasses, cups, eating utensils, towels, bedding, or other items with other people in your home. After using these items, you should wash them thoroughly with soap and water.  Cover your coughs and sneezes Cover your mouth and nose with a tissue when you cough or sneeze, or you can cough or sneeze into your sleeve. Throw used tissues in a lined trash can, and immediately wash your hands with soap and water for at least 20 seconds or use an alcohol-based hand rub.  Wash your [facility] your hands often and thoroughly with soap and water for at least 20 seconds. You can use an alcohol-based hand sanitizer if soap and water are not available and if your hands are not visibly dirty. Avoid touching your eyes, nose, and mouth with unwashed hands.   Prevention Steps for Caregivers and Household Members of Individuals Confirmed to have, or Being Evaluated for, COVID-19 Infection Being Cared for in the Home  If you live with, or provide care at home for, a person confirmed to have, or being evaluated for, COVID-19 infection please follow these guidelines to prevent infection:  Follow healthcare provider's instructions Make sure that you understand and can help the  patient follow any healthcare provider instructions for all care.  Provide for the patient's basic needs You should help the patient with basic needs in the home and provide support for getting groceries, prescriptions, and other personal needs.  Monitor the patient's symptoms If they are getting sicker, call his or her medical provider and tell them that the patient has, or is being evaluated for, COVID-19 infection. This will help the healthcare provider's office take steps to keep other people from getting infected. Ask the healthcare provider to call the local or state health department.  Limit the number of people who have contact with the patient If possible, have only one caregiver for the patient. Other household members should stay in another home or place of residence. If this is not possible, they should stay in another room, or be separated from the patient as much as possible. Use a separate bathroom, if available. Restrict visitors who do not have an essential need to be in the home.  Keep older adults, very young children, and other sick people away from the patient Keep older adults, very young children, and those who have compromised immune systems or chronic health conditions away from the patient. This includes people with chronic heart, lung, or kidney conditions, diabetes, and cancer.  Ensure good ventilation Make sure that  shared spaces in the home have good air flow, such as from an air conditioner or an opened window, weather permitting.  Wash your hands often Wash your hands often and thoroughly with soap and water for at least 20 seconds. You can use an alcohol based hand sanitizer if soap and water are not available and if your hands are not visibly dirty. Avoid touching your eyes, nose, and mouth with unwashed hands. Use disposable paper towels to dry your hands. If not available, use dedicated cloth towels and replace them when they become wet.  Wear a  facemask and gloves Wear a disposable facemask at all times in the room and gloves when you touch or have contact with the patient's blood, body fluids, and/or secretions or excretions, such as sweat, saliva, sputum, nasal mucus, vomit, urine, or feces.  Ensure the mask fits over your nose and mouth tightly, and do not touch it during use. Throw out disposable facemasks and gloves after using them. Do not reuse. Wash your hands immediately after removing your facemask and gloves. If your personal clothing becomes contaminated, carefully remove clothing and launder. Wash your hands after handling contaminated clothing. Place all used disposable facemasks, gloves, and other waste in a lined container before disposing them with other household waste. Remove gloves and wash your hands immediately after handling these items.  Do not share dishes, glasses, or other household items with the patient Avoid sharing household items. You should not share dishes, drinking glasses, cups, eating utensils, towels, bedding, or other items with a patient who is confirmed to have, or being evaluated for, COVID-19 infection. After the person uses these items, you should wash them thoroughly with soap and water.  Wash laundry thoroughly Immediately remove and wash clothes or bedding that have blood, body fluids, and/or secretions or excretions, such as sweat, saliva, sputum, nasal mucus, vomit, urine, or feces, on them. Wear gloves when handling laundry from the patient. Read and follow directions on labels of laundry or clothing items and detergent. In general, wash and dry with the warmest temperatures recommended on the label.  Clean all areas the individual has used often Clean all touchable surfaces, such as counters, tabletops, doorknobs, bathroom fixtures, toilets, phones, keyboards, tablets, and bedside tables, every day. Also, clean any surfaces that may have blood, body fluids, and/or secretions or excretions  on them. Wear gloves when cleaning surfaces the patient has come in contact with. Use a diluted bleach solution (e.g., dilute bleach with 1 part bleach and 10 parts water) or a household disinfectant with a label that says EPA-registered for coronaviruses. To make a bleach solution at home, add 1 tablespoon of bleach to 1 quart (4 cups) of water. For a larger supply, add  cup of bleach to 1 gallon (16 cups) of water. Read labels of cleaning products and follow recommendations provided on product labels. Labels contain instructions for safe and effective use of the cleaning product including precautions you should take when applying the product, such as wearing gloves or eye protection and making sure you have good ventilation during use of the product. Remove gloves and wash hands immediately after cleaning.  Monitor yourself for signs and symptoms of illness Caregivers and household members are considered close contacts, should monitor their health, and will be asked to limit movement outside of the home to the extent possible. Follow the monitoring steps for close contacts listed on the symptom monitoring form.   ? If you have additional questions, contact your local  health department or call the epidemiologist on call at [phone] (available 24/7). ? This guidance is subject to change. For the most up-to-date guidance from [facility], please refer to their website: YouBlogs.pl

## 2020-06-16 LAB — GC/CHLAMYDIA PROBE AMP (~~LOC~~) NOT AT ARMC
Chlamydia: NEGATIVE
Comment: NEGATIVE
Comment: NORMAL
Neisseria Gonorrhea: NEGATIVE

## 2020-06-24 ENCOUNTER — Telehealth: Payer: Self-pay | Admitting: Nurse Practitioner

## 2020-06-24 NOTE — Telephone Encounter (Signed)
Post [facility] [phone] called pt [phone] left message to see how pt is doing with recovery & schedule ED follow up appointment.

## 2020-10-22 ENCOUNTER — Encounter (HOSPITAL_BASED_OUTPATIENT_CLINIC_OR_DEPARTMENT_OTHER): Payer: Self-pay | Admitting: *Deleted

## 2020-10-22 ENCOUNTER — Other Ambulatory Visit: Payer: Self-pay

## 2020-10-22 ENCOUNTER — Emergency Department (HOSPITAL_BASED_OUTPATIENT_CLINIC_OR_DEPARTMENT_OTHER)
Admission: EM | Admit: 2020-10-22 | Discharge: 2020-10-22 | Disposition: A | Discharge status: Home / Self Care | Payer: Medicaid Other | Attending: Emergency Medicine | Admitting: Emergency Medicine

## 2020-10-22 DIAGNOSIS — H60502 Unspecified acute noninfective otitis externa, left ear: Secondary | ICD-10-CM | POA: Diagnosis not present

## 2020-10-22 DIAGNOSIS — Z91010 Allergy to peanuts: Secondary | ICD-10-CM | POA: Insufficient documentation

## 2020-10-22 DIAGNOSIS — H9202 Otalgia, left ear: Secondary | ICD-10-CM | POA: Diagnosis present

## 2020-10-22 MED ORDER — CIPROFLOXACIN-DEXAMETHASONE 0.3-0.1 % OT SUSP
4.0000 [drp] | Freq: Once | OTIC | Status: AC
  Administered 2020-10-22: 4 [drp] via OTIC
  Filled 2020-10-22: qty 7.5

## 2020-10-22 NOTE — ED Triage Notes (Signed)
Left ear pain since yesterday

## 2020-10-22 NOTE — ED Notes (Signed)
Administered gtts pt lying on right side.

## 2020-10-22 NOTE — ED Provider Notes (Signed)
[facility] EMERGENCY DEPARTMENT Provider Note   CSN: [id number] Arrival date & time: 10/22/20  1825     History Chief Complaint  Patient presents with  . Otalgia    Morgan Carlson is a 25 y.o. female.  The history is provided by the patient. No language interpreter was used.  Otalgia Associated symptoms: no fever and no headaches      25 year old female presents for evaluation of ear pain.  Patient reports she normally has itchiness in her ear since she usually uses Q-tips to try and scratch her ear and to ease the discomfort.  For the past 2 days she noticed increasing pain to her left ear.  Pain described as a sharp achy sensation moderate in intensity worse when she pulls on her ear or if she lays on the affected side.  She felt that her hearing is muffled.  She denies any fever headache runny nose sneezing coughing sore throat dental pain neck pain.  She denies any drainage coming from the ear.  No other specific treatment tried.  Past Medical History:  Diagnosis Date  . Medical history non-contributory     Patient Active Problem List   Diagnosis Date Noted  . Encounter for induction of labor 03/02/2019  . Obesity in pregnancy, antepartum 11/06/2018  . Supervision of normal first pregnancy, antepartum 08/09/2018  . Sickle cell trait (HCC) 08/09/2018  . Eczema 08/09/2018    Past Surgical History:  Procedure Laterality Date  . NO PAST SURGERIES       OB History    Gravida  1   Para  1   Term  1   Preterm      AB      Living  1     SAB      IAB      Ectopic      Multiple  0   Live Births  1           No family history on file.  Social History   Tobacco Use  . Smoking status: Never Smoker  . Smokeless tobacco: Never Used  Vaping Use  . Vaping Use: Every day  Substance Use Topics  . Alcohol use: Yes  . Drug use: No    Home Medications Prior to Admission medications   Medication Sig Start Date End Date Taking? Authorizing  Provider  acetaminophen (TYLENOL) 500 MG tablet Take 500 mg by mouth every 6 (six) hours as needed for mild pain.    [provider]  diclofenac Sodium (VOLTAREN) 1 % GEL Apply 4 g topically 4 (four) times daily. 08/17/19   Joy, Shawn C, PA-C  lidocaine (LIDODERM) 5 % Place 1 patch onto the skin daily. Remove & Discard patch within 12 hours or as directed by MD 08/17/19   Joy, Shawn C, PA-C  metroNIDAZOLE (FLAGYL) 500 MG tablet Take 1 tablet (500 mg total) by mouth 2 (two) times daily. 06/15/20   Petrucelli, Samantha R, PA-C  naproxen (NAPROSYN) 500 MG tablet Take 1 tablet (500 mg total) by mouth 2 (two) times daily as needed for moderate pain. 06/15/20   Petrucelli, Samantha R, PA-C  norethindrone (ORTHO MICRONOR) 0.35 MG tablet Take 1 tablet (0.35 mg total) by mouth daily for 28 days. 04/11/19 05/09/19  Levie Heritage, DO  Norethindrone Acetate-Ethinyl Estrad-FE (LOESTRIN 24 FE) 1-20 MG-MCG(24) tablet Take 1 tablet by mouth daily. Start in 1 month 04/11/19   Levie Heritage, DO  Prenatal Vit-Fe Fumarate-FA (PRENATAL VITAMINS  PO) Take by mouth.    [provider]  cetirizine (ZYRTEC ALLERGY) 10 MG tablet Take 1 tablet (10 mg total) by mouth daily. Patient not taking: Reported on 02/22/2019 01/24/18 06/15/20  Wallis Bamberg, PA-C  fluticasone [facility]) 50 MCG/ACT nasal spray Place 2 sprays into both nostrils daily. Patient not taking: Reported on 03/02/2019 01/24/18 06/15/20  Wallis Bamberg, PA-C    Allergies    Peanut-containing drug products  Review of Systems   Review of Systems  Constitutional: Negative for fever.  HENT: Positive for ear pain.   Neurological: Negative for headaches.    Physical Exam Updated Vital Signs BP 128/80 (BP Location: Left Arm)   Pulse 95   Temp 98.5 F (36.9 C) (Oral)   Resp 18   Ht 5\' 7"  (1.702 m)   Wt 121.6 kg   LMP 10/01/2020   SpO2 100%   BMI 41.99 kg/m   Physical Exam Vitals and nursing note reviewed.  Constitutional:      General:  She is not in acute distress.    Appearance: She is well-developed.  HENT:     Head: Atraumatic.     Ears:     Comments: Left ear: Tenderness to palpations of the earlobe and tragus without any erythema.  Ear canal is edematous and tender.  TM is mildly erythematous but no effusion.  No mastoiditis.  Right ear: Normal external ear, normal TM. Eyes:     Conjunctiva/sclera: Conjunctivae normal.  Musculoskeletal:     Cervical back: Normal range of motion and neck supple. No rigidity.  Lymphadenopathy:     Cervical: No cervical adenopathy.  Skin:    Findings: No rash.  Neurological:     Mental Status: She is alert. Mental status is at baseline.  Psychiatric:        Mood and Affect: Mood normal.     ED Results / Procedures / Treatments   Labs (all labs ordered are listed, but only abnormal results are displayed) Labs Reviewed - No data to display  EKG None  Radiology No results found.  Procedures Procedures   Medications Ordered in ED Medications  ciprofloxacin-dexamethasone (CIPRODEX) 0.3-0.1 % OTIC (EAR) suspension 4 drop (has no administration in time range)    ED Course  I have reviewed the triage vital signs and the nursing notes.  Pertinent labs & imaging results that were available during my care of the patient were reviewed by me and considered in my medical decision making (see chart for details).    MDM Rules/Calculators/A&P                          BP 128/80 (BP Location: Left Arm)   Pulse 95   Temp 98.5 F (36.9 C) (Oral)   Resp 18   Ht 5\' 7"  (1.702 m)   Wt 121.6 kg   LMP 10/01/2020   SpO2 100%   BMI 41.99 kg/m   Final Clinical Impression(s) / ED Diagnoses Final diagnoses:  Acute otitis externa of left ear, unspecified type    Rx / DC Orders ED Discharge Orders    None     6:55 PM Patient here with left ear pain with finding consistent with otitis externa.  Recommend patient to avoid using Q-tips to clean her ear as it increased risk of  infection.  We will treat with Cipro otic drops.   , PA-C 10/22/20 1859    Fayrene Helper, MD 10/23/20 [phone]

## 2020-10-22 NOTE — Discharge Instructions (Addendum)
You have been diagnosed with otitis externa, outer ear infection.  Please apply 3-4 antibiotic eardrops to left ear twice daily for the next 7 days.  You may take over-the-counter Tylenol or ibuprofen as needed for pain.  Return if you have any concern.

## 2020-10-22 NOTE — ED Notes (Signed)
Left ear ache with increasing pain over the last two days. Pt has taken tylenol. Denies any use of OTC gtts.

## 2020-12-18 ENCOUNTER — Other Ambulatory Visit: Payer: Self-pay

## 2020-12-18 ENCOUNTER — Emergency Department (HOSPITAL_BASED_OUTPATIENT_CLINIC_OR_DEPARTMENT_OTHER)
Admission: EM | Admit: 2020-12-18 | Discharge: 2020-12-18 | Disposition: A | Discharge status: Home / Self Care | Payer: Medicaid Other | Attending: Emergency Medicine | Admitting: Emergency Medicine

## 2020-12-18 ENCOUNTER — Encounter (HOSPITAL_BASED_OUTPATIENT_CLINIC_OR_DEPARTMENT_OTHER): Payer: Self-pay | Admitting: *Deleted

## 2020-12-18 DIAGNOSIS — K0889 Other specified disorders of teeth and supporting structures: Secondary | ICD-10-CM

## 2020-12-18 DIAGNOSIS — Z91010 Allergy to peanuts: Secondary | ICD-10-CM | POA: Diagnosis not present

## 2020-12-18 DIAGNOSIS — K029 Dental caries, unspecified: Secondary | ICD-10-CM | POA: Insufficient documentation

## 2020-12-18 MED ORDER — PENICILLIN V POTASSIUM 250 MG PO TABS
500.0000 mg | ORAL_TABLET | Freq: Once | ORAL | Status: AC
  Administered 2020-12-18: 500 mg via ORAL
  Filled 2020-12-18: qty 2

## 2020-12-18 MED ORDER — KETOROLAC TROMETHAMINE 30 MG/ML IJ SOLN
30.0000 mg | Freq: Once | INTRAMUSCULAR | Status: AC
  Administered 2020-12-18: 30 mg via INTRAMUSCULAR
  Filled 2020-12-18: qty 1

## 2020-12-18 MED ORDER — PENICILLIN V POTASSIUM 500 MG PO TABS: 500.0000 mg | ORAL_TABLET | Freq: Three times a day (TID) | ORAL | 0 refills | Status: AC

## 2020-12-18 NOTE — ED Triage Notes (Signed)
Dental pain x 4 months. She has been taking Tylenol.

## 2020-12-18 NOTE — Discharge Instructions (Signed)
Please read and follow all provided instructions.  Your diagnoses today include:  1. Pain, dental    The exam and treatment you received today has been provided on an emergency basis only. This is not a substitute for complete medical or dental care.  Tests performed today include: Vital signs. See below for your results today.   Medications prescribed:  Penicillin - antibiotic  You have been prescribed an antibiotic medicine: take the entire course of medicine even if you are feeling better. Stopping early can cause the antibiotic not to work.  Take any prescribed medications only as directed.  Home care instructions:  Follow any educational materials contained in this packet.  Follow-up instructions: Please follow-up with your dentist for further evaluation of your symptoms.   Dental Assistance: See attached dental referral and/or resource guide.   Return instructions:  Please return to the Emergency Department if you experience worsening symptoms. Please return if you develop a fever, you develop more swelling in your face or neck, you have trouble breathing or swallowing food. Please return if you have any other emergent concerns.  Additional Information:  Your vital signs today were: BP 130/86 (BP Location: Right Arm)   Pulse 77   Temp 98.4 F (36.9 C) (Oral)   Resp 18   Ht 5\' 7"  (1.702 m)   Wt 128.1 kg   LMP 11/25/2020   SpO2 92%   BMI 44.25 kg/m  If your blood pressure (BP) was elevated above 135/85 this visit, please have this repeated by your doctor within one month. --------------

## 2020-12-18 NOTE — ED Provider Notes (Signed)
[facility] EMERGENCY DEPARTMENT Provider Note   CSN: [id number] Arrival date & time: 12/18/20  2136     History Chief Complaint  Patient presents with   Dental Pain    Morgan Carlson is a 25 y.o. female.  Patient presents to the emergency department for evaluation of worsening right-sided dental pain.  She states that she has pain in her right upper and lower jaw over the past 4 months.  She has been taking Tylenol and ibuprofen.  Over the past several days the pain became worse, less controlled with OTC pain medications.  She was concerned about an infection.  No facial swelling.  No difficulty breathing or swallowing.  She does not currently have dental coverage.      Past Medical History:  Diagnosis Date   Medical history non-contributory     Patient Active Problem List   Diagnosis Date Noted   Encounter for induction of labor 03/02/2019   Obesity in pregnancy, antepartum 11/06/2018   Supervision of normal first pregnancy, antepartum 08/09/2018   Sickle cell trait (HCC) 08/09/2018   Eczema 08/09/2018    Past Surgical History:  Procedure Laterality Date   NO PAST SURGERIES       OB History     Gravida  1   Para  1   Term  1   Preterm      AB      Living  1      SAB      IAB      Ectopic      Multiple  0   Live Births  1           No family history on file.  Social History   Tobacco Use   Smoking status: Never   Smokeless tobacco: Never  Vaping Use   Vaping Use: Every day  Substance Use Topics   Alcohol use: Yes   Drug use: No    Home Medications Prior to Admission medications   Medication Sig Start Date End Date Taking? Authorizing Provider  acetaminophen (TYLENOL) 500 MG tablet Take 500 mg by mouth every 6 (six) hours as needed for mild pain.   Yes [provider]  penicillin v potassium (VEETID) 500 MG tablet Take 1 tablet (500 mg total) by mouth 3 (three) times daily. 12/18/20  Yes Renne Crigler, PA-C   diclofenac Sodium (VOLTAREN) 1 % GEL Apply 4 g topically 4 (four) times daily. 08/17/19   Joy, Shawn C, PA-C  lidocaine (LIDODERM) 5 % Place 1 patch onto the skin daily. Remove & Discard patch within 12 hours or as directed by MD 08/17/19   Joy, Shawn C, PA-C  metroNIDAZOLE (FLAGYL) 500 MG tablet Take 1 tablet (500 mg total) by mouth 2 (two) times daily. 06/15/20   Petrucelli, Samantha R, PA-C  naproxen (NAPROSYN) 500 MG tablet Take 1 tablet (500 mg total) by mouth 2 (two) times daily as needed for moderate pain. 06/15/20   Petrucelli, Samantha R, PA-C  norethindrone (ORTHO MICRONOR) 0.35 MG tablet Take 1 tablet (0.35 mg total) by mouth daily for 28 days. 04/11/19 05/09/19  Levie Heritage, DO  Norethindrone Acetate-Ethinyl Estrad-FE (LOESTRIN 24 FE) 1-20 MG-MCG(24) tablet Take 1 tablet by mouth daily. Start in 1 month 04/11/19   Levie Heritage, DO  Prenatal Vit-Fe Fumarate-FA (PRENATAL VITAMINS PO) Take by mouth.    [provider]  cetirizine (ZYRTEC ALLERGY) 10 MG tablet Take 1 tablet (10 mg total) by mouth daily. Patient  not taking: Reported on 02/22/2019 01/24/18 06/15/20  Wallis Bamberg, PA-C  fluticasone [facility]) 50 MCG/ACT nasal spray Place 2 sprays into both nostrils daily. Patient not taking: Reported on 03/02/2019 01/24/18 06/15/20  Wallis Bamberg, PA-C    Allergies    Peanut-containing drug products  Review of Systems   Review of Systems  Constitutional:  Negative for fever.  HENT:  Positive for dental problem. Negative for ear pain, facial swelling, sore throat and trouble swallowing.   Respiratory:  Negative for shortness of breath and stridor.   Musculoskeletal:  Negative for neck pain.  Skin:  Negative for color change.  Neurological:  Negative for headaches.   Physical Exam Updated Vital Signs BP 130/86 (BP Location: Right Arm)   Pulse 77   Temp 98.4 F (36.9 C) (Oral)   Resp 18   Ht 5\' 7"  (1.702 m)   Wt 128.1 kg   LMP 11/25/2020   SpO2 92%   BMI 44.25 kg/m    Physical Exam Vitals and nursing note reviewed.  Constitutional:      Appearance: She is well-developed.  HENT:     Head: Normocephalic and atraumatic.     Jaw: No trismus.     Right Ear: Tympanic membrane, ear canal and external ear normal.     Left Ear: Tympanic membrane, ear canal and external ear normal.     Nose: Nose normal.     Mouth/Throat:     Dentition: Abnormal dentition. Dental caries present. No dental abscesses.     Pharynx: Uvula midline. No uvula swelling.     Tonsils: No tonsillar abscesses.     Comments: Mild inflammation of the gums, right upper and lower jaw.  No significant abscess.  There is a hole in tooth #32. Eyes:     Conjunctiva/sclera: Conjunctivae normal.  Neck:     Comments: No neck swelling or Ludwig's angina Musculoskeletal:     Cervical back: Normal range of motion and neck supple.  Lymphadenopathy:     Cervical: No cervical adenopathy.  Skin:    General: Skin is warm and dry.  Neurological:     Mental Status: She is alert.    ED Results / Procedures / Treatments   Labs (all labs ordered are listed, but only abnormal results are displayed) Labs Reviewed - No data to display  EKG None  Radiology No results found.  Procedures Procedures   Medications Ordered in ED Medications  ketorolac (TORADOL) 30 MG/ML injection 30 mg (has no administration in time range)  penicillin v potassium (VEETID) tablet 500 mg (has no administration in time range)    ED Course  I have reviewed the triage vital signs and the nursing notes.  Pertinent labs & imaging results that were available during my care of the patient were reviewed by me and considered in my medical decision making (see chart for details).  11:28 PM Patient seen and examined. Medications ordered - toradol for pain, penicillin.   Vital signs reviewed and are as follows: BP 130/86 (BP Location: Right Arm)   Pulse 77   Temp 98.4 F (36.9 C) (Oral)   Resp 18   Ht 5\' 7"  (1.702  m)   Wt 128.1 kg   LMP 11/25/2020   SpO2 92%   BMI 44.25 kg/m   Urged not to drink alcohol, drive, or perform any other activities that requires focus while taking these medications. The patient verbalizes understanding and agrees with the plan.  Patient counseled to take prescribed medications as  directed, return with worsening facial or neck swelling, and to follow-up with their dentist as soon as possible.      MDM Rules/Calculators/A&P                          Patient presents for dental pain. They do not have a fever and do not appear septic. Exam unconcerning for Ludwig's angina or other deep tissue infection in neck and I do not feel that advanced imaging is indicated at this time. Low suspicion for PTA, RPA, epiglottis based on exam.   Patient will be treated for dental infection with antibiotic. Encouraged tylenol/NSAIDs as prescribed or as directed on the packaging for pain. Encouraged follow-up with a dentist for definitive and long-term management.   Final Clinical Impression(s) / ED Diagnoses Final diagnoses:  Pain, dental    Rx / DC Orders ED Discharge Orders          Ordered    penicillin v potassium (VEETID) 500 MG tablet  3 times daily        12/18/20 2317             Renne Crigler, PA-C 12/18/20 2329    Molpus, Jonny Ruiz, MD 12/19/20 [phone]

## 2021-02-11 ENCOUNTER — Encounter (HOSPITAL_BASED_OUTPATIENT_CLINIC_OR_DEPARTMENT_OTHER): Payer: Self-pay | Admitting: Emergency Medicine

## 2021-02-11 ENCOUNTER — Emergency Department (HOSPITAL_BASED_OUTPATIENT_CLINIC_OR_DEPARTMENT_OTHER)
Admission: EM | Admit: 2021-02-11 | Discharge: 2021-02-11 | Disposition: A | Discharge status: Home / Self Care | Payer: Medicaid Other | Attending: Emergency Medicine | Admitting: Emergency Medicine

## 2021-02-11 ENCOUNTER — Other Ambulatory Visit: Payer: Self-pay

## 2021-02-11 DIAGNOSIS — R112 Nausea with vomiting, unspecified: Secondary | ICD-10-CM | POA: Diagnosis not present

## 2021-02-11 DIAGNOSIS — R110 Nausea: Secondary | ICD-10-CM | POA: Diagnosis present

## 2021-02-11 DIAGNOSIS — Z91010 Allergy to peanuts: Secondary | ICD-10-CM | POA: Insufficient documentation

## 2021-02-11 LAB — COMPREHENSIVE METABOLIC PANEL
ALT: 22 U/L (ref 0–44)
AST: 21 U/L (ref 15–41)
Albumin: 4.3 g/dL (ref 3.5–5.0)
Alkaline Phosphatase: 89 U/L (ref 38–126)
Anion gap: 8 (ref 5–15)
BUN: 12 mg/dL (ref 6–20)
CO2: 28 mmol/L (ref 22–32)
Calcium: 9.6 mg/dL (ref 8.9–10.3)
Chloride: 100 mmol/L (ref 98–111)
Creatinine, Ser: 0.81 mg/dL (ref 0.44–1.00)
GFR, Estimated: >60 mL/min (ref >60)
Glucose, Bld: 87 mg/dL (ref 70–99)
Potassium: 3.8 mmol/L (ref 3.5–5.1)
Sodium: 136 mmol/L (ref 135–145)
Total Bilirubin: 0.6 mg/dL (ref 0.3–1.2)
Total Protein: 8.1 g/dL (ref 6.5–8.1)

## 2021-02-11 LAB — CBC WITH DIFFERENTIAL/PLATELET
Abs Immature Granulocytes: 0.06 10*3/uL (ref 0.00–0.07)
Basophils Absolute: 0 10*3/uL (ref 0.0–0.1)
Basophils Relative: 1 %
Eosinophils Absolute: 0.3 10*3/uL (ref 0.0–0.5)
Eosinophils Relative: 4 %
HCT: 38.7 % (ref 36.0–46.0)
Hemoglobin: 12.9 g/dL (ref 12.0–15.0)
Immature Granulocytes: 1 %
Lymphocytes Relative: 41 %
Lymphs Abs: 3.5 10*3/uL (ref 0.7–4.0)
MCH: 25.8 pg — ABNORMAL LOW (ref 26.0–34.0)
MCHC: 33.3 g/dL (ref 30.0–36.0)
MCV: 77.4 fL — ABNORMAL LOW (ref 80.0–100.0)
Monocytes Absolute: 0.5 10*3/uL (ref 0.1–1.0)
Monocytes Relative: 5 %
Neutro Abs: 4.2 10*3/uL (ref 1.7–7.7)
Neutrophils Relative %: 48 %
Platelets: 333 10*3/uL (ref 150–400)
RBC: 5 MIL/uL (ref 3.87–5.11)
RDW: 14.6 % (ref 11.5–15.5)
WBC: 8.5 10*3/uL (ref 4.0–10.5)
nRBC: 0 % (ref 0.0–0.2)

## 2021-02-11 LAB — LIPASE, BLOOD: Lipase: 21 U/L (ref 11–51)

## 2021-02-11 LAB — PREGNANCY, URINE: Preg Test, Ur: NEGATIVE

## 2021-02-11 MED ORDER — PROCHLORPERAZINE EDISYLATE 10 MG/2ML IJ SOLN
10.0000 mg | Freq: Once | INTRAMUSCULAR | Status: DC
  Filled 2021-02-11: qty 2

## 2021-02-11 MED ORDER — ONDANSETRON HCL 4 MG/2ML IJ SOLN
4.0000 mg | Freq: Once | INTRAMUSCULAR | Status: AC
  Administered 2021-02-11: 4 mg via INTRAVENOUS
  Filled 2021-02-11: qty 2

## 2021-02-11 MED ORDER — PANTOPRAZOLE SODIUM 20 MG PO TBEC: 20.0000 mg | DELAYED_RELEASE_TABLET | Freq: Every day | ORAL | 0 refills | Status: AC

## 2021-02-11 MED ORDER — DIPHENHYDRAMINE HCL 50 MG/ML IJ SOLN
25.0000 mg | Freq: Once | INTRAMUSCULAR | Status: DC
  Filled 2021-02-11: qty 1

## 2021-02-11 MED ORDER — SODIUM CHLORIDE 0.9 % IV BOLUS
1000.0000 mL | Freq: Once | INTRAVENOUS | Status: AC
  Administered 2021-02-11: 1000 mL via INTRAVENOUS

## 2021-02-11 MED ORDER — ONDANSETRON HCL 4 MG PO TABS
4.0000 mg | ORAL_TABLET | Freq: Four times a day (QID) | ORAL | 0 refills | Status: AC
Start: 2021-02-11 — End: ?

## 2021-02-11 NOTE — ED Triage Notes (Signed)
Patient arrived via POV c/o headache with nausea x 4 days. Patient states worse with movement/eating and during the evening. Patient states minor relief with pepto at 1600. Patient denies emesis. Patient is AO x 4, VS WDL, normal gait.

## 2021-02-11 NOTE — Discharge Instructions (Addendum)
Follow-up with your primary care doctor.  If you do not have 1 follow-up with the wellness center.  Take Zofran as needed for nausea and vomiting.  Take Protonix to help with any stomach acid that might be contributing to your discomfort.

## 2021-02-11 NOTE — ED Provider Notes (Signed)
[facility] EMERGENCY DEPARTMENT Provider Note   CSN: [id number] Arrival date & time: 02/11/21  2114     History Chief Complaint  Patient presents with   Nausea    Morgan Carlson is a 25 y.o. female.  Patient with may be some suspicious food intake recently.  Has been having some nausea and some emesis.  States negative home pregnancy test today.  Feeling better after taking some ibuprofen.  The history is provided by the patient.  Emesis Severity:  Mild Timing:  Intermittent Able to tolerate:  Liquids Progression:  Unchanged Chronicity:  New Recent urination:  Normal Relieved by:  Nothing Worsened by:  Nothing Associated symptoms: abdominal pain   Associated symptoms: no arthralgias, no chills, no cough, no diarrhea, no fever, no headaches, no myalgias, no sore throat and no URI   Risk factors: suspect food intake   Risk factors: no sick contacts       Past Medical History:  Diagnosis Date   Medical history non-contributory     Patient Active Problem List   Diagnosis Date Noted   Encounter for induction of labor 03/02/2019   Obesity in pregnancy, antepartum 11/06/2018   Supervision of normal first pregnancy, antepartum 08/09/2018   Sickle cell trait (HCC) 08/09/2018   Eczema 08/09/2018    Past Surgical History:  Procedure Laterality Date   NO PAST SURGERIES       OB History     Gravida  1   Para  1   Term  1   Preterm      AB      Living  1      SAB      IAB      Ectopic      Multiple  0   Live Births  1           No family history on file.  Social History   Tobacco Use   Smoking status: Never   Smokeless tobacco: Never  Vaping Use   Vaping Use: Every day  Substance Use Topics   Alcohol use: Yes   Drug use: No    Home Medications Prior to Admission medications   Medication Sig Start Date End Date Taking? Authorizing Provider  ondansetron (ZOFRAN) 4 MG tablet Take 1 tablet (4 mg total) by mouth every 6  (six) hours. 02/11/21  Yes Rahshawn Remo, DO  pantoprazole (PROTONIX) 20 MG tablet Take 1 tablet (20 mg total) by mouth daily for 14 days. 02/11/21 02/25/21 Yes Linh Hedberg, DO  acetaminophen (TYLENOL) 500 MG tablet Take 500 mg by mouth every 6 (six) hours as needed for mild pain.    [provider]  diclofenac Sodium (VOLTAREN) 1 % GEL Apply 4 g topically 4 (four) times daily. 08/17/19   Joy, Shawn C, PA-C  lidocaine (LIDODERM) 5 % Place 1 patch onto the skin daily. Remove & Discard patch within 12 hours or as directed by MD 08/17/19   Joy, Shawn C, PA-C  metroNIDAZOLE (FLAGYL) 500 MG tablet Take 1 tablet (500 mg total) by mouth 2 (two) times daily. 06/15/20   Petrucelli, Samantha R, PA-C  naproxen (NAPROSYN) 500 MG tablet Take 1 tablet (500 mg total) by mouth 2 (two) times daily as needed for moderate pain. 06/15/20   Petrucelli, Samantha R, PA-C  norethindrone (ORTHO MICRONOR) 0.35 MG tablet Take 1 tablet (0.35 mg total) by mouth daily for 28 days. 04/11/19 05/09/19  Levie Heritage, DO  Norethindrone Acetate-Ethinyl Estrad-FE (LOESTRIN 24  FE) 1-20 MG-MCG(24) tablet Take 1 tablet by mouth daily. Start in 1 month 04/11/19   Levie Heritage, DO  penicillin v potassium (VEETID) 500 MG tablet Take 1 tablet (500 mg total) by mouth 3 (three) times daily. 12/18/20   Renne Crigler, PA-C  Prenatal Vit-Fe Fumarate-FA (PRENATAL VITAMINS PO) Take by mouth.    [provider]  cetirizine (ZYRTEC ALLERGY) 10 MG tablet Take 1 tablet (10 mg total) by mouth daily. Patient not taking: Reported on 02/22/2019 01/24/18 06/15/20  Wallis Bamberg, PA-C  fluticasone [facility]) 50 MCG/ACT nasal spray Place 2 sprays into both nostrils daily. Patient not taking: Reported on 03/02/2019 01/24/18 06/15/20  Wallis Bamberg, PA-C    Allergies    Peanut-containing drug products  Review of Systems   Review of Systems  Constitutional:  Negative for chills and fever.  HENT:  Negative for ear pain and sore throat.   Eyes:   Negative for pain and visual disturbance.  Respiratory:  Negative for cough and shortness of breath.   Cardiovascular:  Negative for chest pain and palpitations.  Gastrointestinal:  Positive for abdominal pain and vomiting. Negative for diarrhea.  Genitourinary:  Negative for dysuria and hematuria.  Musculoskeletal:  Negative for arthralgias, back pain and myalgias.  Skin:  Negative for color change and rash.  Neurological:  Negative for seizures, syncope and headaches.  All other systems reviewed and are negative.  Physical Exam Updated Vital Signs BP 133/82 (BP Location: Left Arm)   Pulse 99   Temp 98.8 F (37.1 C) (Oral)   Resp 20   Ht 5\' 6"  (1.676 m)   Wt 122.5 kg   LMP 01/23/2021 (Approximate)   SpO2 100%   BMI 43.58 kg/m   Physical Exam Vitals and nursing note reviewed.  Constitutional:      General: She is not in acute distress.    Appearance: She is well-developed.  HENT:     Head: Normocephalic and atraumatic.     Mouth/Throat:     Mouth: Mucous membranes are moist.  Eyes:     Extraocular Movements: Extraocular movements intact.     Conjunctiva/sclera: Conjunctivae normal.     Pupils: Pupils are equal, round, and reactive to light.  Cardiovascular:     Rate and Rhythm: Normal rate and regular rhythm.     Heart sounds: No murmur heard. Pulmonary:     Effort: Pulmonary effort is normal. No respiratory distress.     Breath sounds: Normal breath sounds.  Abdominal:     Palpations: Abdomen is soft.     Tenderness: There is abdominal tenderness (epigastric). There is no guarding.  Musculoskeletal:     Cervical back: Neck supple.  Skin:    General: Skin is warm and dry.     Capillary Refill: Capillary refill takes less than 2 seconds.  Neurological:     Mental Status: She is alert and oriented to person, place, and time.     Cranial Nerves: No cranial nerve deficit.     Sensory: No sensory deficit.     Motor: No weakness.  Psychiatric:        Mood and  Affect: Mood normal.    ED Results / Procedures / Treatments   Labs (all labs ordered are listed, but only abnormal results are displayed) Labs Reviewed  CBC WITH DIFFERENTIAL/PLATELET - Abnormal; Notable for the following components:      Result Value   MCV 77.4 (*)    MCH 25.8 (*)    All other  components within normal limits  COMPREHENSIVE METABOLIC PANEL  LIPASE, BLOOD  PREGNANCY, URINE    EKG None  Radiology No results found.  Procedures Procedures   Medications Ordered in ED Medications  sodium chloride 0.9 % bolus 1,000 mL (1,000 mLs Intravenous New Bag/Given 02/11/21 2215)  ondansetron (ZOFRAN) injection 4 mg (4 mg Intravenous Given 02/11/21 2213)    ED Course  I have reviewed the triage vital signs and the nursing notes.  Pertinent labs & imaging results that were available during my care of the patient were reviewed by me and considered in my medical decision making (see chart for details).    MDM Rules/Calculators/A&P                           Aaima Gaddie is a 25 year old female who presents the ED with nausea.  Normal vitals.  No fever.  Some mild upper abdominal pain.  Thinks may be suspicious food intake recently.  She is eating cheese its upon my evaluation and appears comfortable.  She does have some mild tenderness in the epigastric region.  Suspect may be some gastritis.  No concern for appendicitis or other intra-abdominal process.  She does admit that she had a negative pregnancy test at home today.  We will give her fluid bolus, Zofran check basic labs.  Have low suspicion for pancreatitis or cholecystitis.  She is not having any pain with urination.  No concern for UTI.  Liver enzymes unremarkable.  Gallbladder enzymes normal and cholecystitis.  Lipase normal doubt pancreatitis.  No significant leukocytosis or anemia or electrolyte abnormality.  No kidney injury.  Pregnancy test is negative.  Will prescribe Zofran, Protonix.  Have her follow-up with  primary care doctor.  Discharged in good condition.  This chart was dictated using voice recognition software.  Despite best efforts to proofread,  errors can occur which can change the documentation meaning.   Final Clinical Impression(s) / ED Diagnoses Final diagnoses:  Nausea    Rx / DC Orders ED Discharge Orders          Ordered    ondansetron (ZOFRAN) 4 MG tablet  Every 6 hours        02/11/21 2301    pantoprazole (PROTONIX) 20 MG tablet  Daily        02/11/21 2301             Virgina Norfolk, DO 02/11/21 2302
